# Patient Record
Sex: Male | Born: 1975 | Race: White | Marital: Married
Health system: Northeastern US, Academic
[De-identification: ages and names within clinical notes are randomized; demographics above are authoritative.]

## PROBLEM LIST (undated history)

## (undated) DIAGNOSIS — F329 Major depressive disorder, single episode, unspecified: Secondary | ICD-10-CM

## (undated) DIAGNOSIS — F32A Depression, unspecified: Secondary | ICD-10-CM

## (undated) DIAGNOSIS — K219 Gastro-esophageal reflux disease without esophagitis: Secondary | ICD-10-CM

## (undated) DIAGNOSIS — M1990 Unspecified osteoarthritis, unspecified site: Secondary | ICD-10-CM

## (undated) DIAGNOSIS — N200 Calculus of kidney: Secondary | ICD-10-CM

## (undated) DIAGNOSIS — K859 Acute pancreatitis without necrosis or infection, unspecified: Secondary | ICD-10-CM

## (undated) DIAGNOSIS — E785 Hyperlipidemia, unspecified: Secondary | ICD-10-CM

## (undated) HISTORY — DX: Acute pancreatitis without necrosis or infection, unspecified: K85.90

## (undated) HISTORY — DX: Hyperlipidemia, unspecified: E78.5

## (undated) HISTORY — DX: Gastro-esophageal reflux disease without esophagitis: K21.9

## (undated) HISTORY — PX: OTHER SURGICAL HISTORY: SHX169

## (undated) HISTORY — DX: Unspecified osteoarthritis, unspecified site: M19.90

---

## 1994-04-20 HISTORY — PX: KIDNEY STONE SURGERY: SHX686

## 2004-03-03 ENCOUNTER — Emergency Department (HOSPITAL_COMMUNITY): Admission: EM | Admit: 2004-03-03 | Discharge: 2004-03-04 | Payer: Self-pay | Admitting: Emergency Medicine

## 2009-02-20 ENCOUNTER — Ambulatory Visit: Payer: Self-pay | Admitting: Diagnostic Radiology

## 2009-02-20 ENCOUNTER — Emergency Department (HOSPITAL_BASED_OUTPATIENT_CLINIC_OR_DEPARTMENT_OTHER): Admission: EM | Admit: 2009-02-20 | Discharge: 2009-02-20 | Payer: Self-pay | Admitting: Emergency Medicine

## 2012-12-25 ENCOUNTER — Encounter (HOSPITAL_COMMUNITY): Payer: Self-pay | Admitting: Emergency Medicine

## 2012-12-25 ENCOUNTER — Emergency Department (HOSPITAL_COMMUNITY)
Admission: EM | Admit: 2012-12-25 | Discharge: 2012-12-25 | Disposition: A | Discharge status: Home / Self Care | Payer: Self-pay | Attending: Emergency Medicine | Admitting: Emergency Medicine

## 2012-12-25 DIAGNOSIS — M7020 Olecranon bursitis, unspecified elbow: Secondary | ICD-10-CM | POA: Insufficient documentation

## 2012-12-25 DIAGNOSIS — Z79899 Other long term (current) drug therapy: Secondary | ICD-10-CM | POA: Insufficient documentation

## 2012-12-25 DIAGNOSIS — M7022 Olecranon bursitis, left elbow: Secondary | ICD-10-CM

## 2012-12-25 DIAGNOSIS — F17200 Nicotine dependence, unspecified, uncomplicated: Secondary | ICD-10-CM | POA: Insufficient documentation

## 2012-12-25 DIAGNOSIS — Z87442 Personal history of urinary calculi: Secondary | ICD-10-CM | POA: Insufficient documentation

## 2012-12-25 HISTORY — DX: Calculus of kidney: N20.0

## 2012-12-25 MED ORDER — OXYCODONE-ACETAMINOPHEN 5-325 MG PO TABS: 1.0000 | ORAL_TABLET | ORAL | Status: DC | PRN

## 2012-12-25 MED ORDER — OXYCODONE-ACETAMINOPHEN 5-325 MG PO TABS
1.0000 | ORAL_TABLET | Freq: Once | ORAL | Status: AC
  Administered 2012-12-25: 1 via ORAL
  Filled 2012-12-25: qty 1

## 2012-12-25 MED ORDER — CEPHALEXIN 500 MG PO CAPS: 500.0000 mg | ORAL_CAPSULE | Freq: Four times a day (QID) | ORAL | Status: DC

## 2012-12-25 MED ORDER — SULFAMETHOXAZOLE-TRIMETHOPRIM 800-160 MG PO TABS: 1.0000 | ORAL_TABLET | Freq: Two times a day (BID) | ORAL | Status: DC

## 2012-12-25 NOTE — ED Provider Notes (Signed)
Medical screening examination/treatment/procedure(s) were conducted as a shared visit with non-physician practitioner(s) and myself.  I personally evaluated the patient during the encounter.  EKG Interpretation   None       Patient here with elbow pain. No systemic symptoms. Olecranon bursitis on exam, no concern for septic arthritis of the left elbow. Full ROM, no effusion. Antibiotics, Ortho f/u.  Dagmar Hait, MD 12/25/12 1045

## 2012-12-25 NOTE — ED Provider Notes (Signed)
CSN: [id number]     Arrival date & time 12/25/12  4098 History   First MD Initiated Contact with Patient 12/25/12 [phone]     Chief Complaint  Patient presents with  . left arm swelling   . Arm Pain    left   (Consider location/radiation/quality/duration/timing/severity/associated sxs/prior Treatment) HPI Patient presents with left elbow pain and swelling that began yesterday.  Denies any trauma to the elbow or any repetitive stress to elbow.  Denies fevers, chills, body ache, other joint pain or swelling.  Has had recent bronchitis several weeks ago but denies any other recent infection, denies recent STD treatments or symptoms.  Pain is 8/10, it does not radiate, worse palpation and movement.  Has used ice and ibuprofen without improvement.   Past Medical History  Diagnosis Date  . Kidney stone    Past Surgical History  Procedure Laterality Date  . Kidney stone surgery Right 04/20/1994   History reviewed. No pertinent family history. History  Substance Use Topics  . Smoking status: Light Tobacco Smoker    Types: Cigarettes  . Smokeless tobacco: Not on file  . Alcohol Use: Yes     Comment: everyday    Review of Systems  Constitutional: Negative for fever and chills.  Gastrointestinal: Negative for vomiting, abdominal pain and diarrhea.  Skin: Positive for color change.  Neurological: Negative for weakness and numbness.    Allergies  Doxycycline  Home Medications   Current Outpatient Rx  Name  Route  Sig  Dispense  Refill  . buPROPion (WELLBUTRIN XL) 150 MG 24 hr tablet   Oral   Take 150 mg by mouth daily.         Marland Kitchen ibuprofen (ADVIL,MOTRIN) 200 MG tablet   Oral   Take 1,000 mg by mouth every 6 (six) hours as needed.         Marland Kitchen LORazepam (ATIVAN) 1 MG tablet   Oral   Take 1 mg by mouth 2 (two) times daily as needed for anxiety.         Marland Kitchen omeprazole (PRILOSEC) 10 MG capsule   Oral   Take 10 mg by mouth daily.         . traZODone (DESYREL) 100 MG tablet  Oral   Take 100-300 mg by mouth at bedtime.         . Vilazodone HCl (VIIBRYD) 40 MG TABS   Oral   Take 40 mg by mouth daily.          BP 116/63  Pulse 76  Temp(Src) 98.2 F (36.8 C) (Oral)  Resp 16  SpO2 97% Physical Exam  Nursing note and vitals reviewed. Constitutional: He appears well-developed and well-nourished. No distress.  HENT:  Head: Normocephalic and atraumatic.  Neck: Neck supple.  Pulmonary/Chest: Effort normal.  Musculoskeletal:       Left elbow: He exhibits normal range of motion, no effusion, no deformity and no laceration. No tenderness found.  Left elbow:  Bursa fluctuant, overlying erythema and warmth, tender to palpation.  No bony tenderness.  Distal pulses and sensation intact.  Full AROM, pain with complete extension.  Joint itself if nontender.   Neurological: He is alert.  Skin: He is not diaphoretic.    ED Course  Procedures (including critical care time) Labs Review Labs Reviewed - No data to display Imaging Review No results found.  EKG Interpretation   None      9:41 AM Discussed pt and treatment plan with Dr Gwendolyn Grant who has also  seen and examined the patient.   MDM   1. Olecranon bursitis of left elbow    Pt with spontaneous bursitis of left elbow without injury. Suspect septic bursitis.  No systemic symptoms.  No e/o septic joint.  D/C home with bactrim, keflex, percocet.  Ortho follow up for worsening symptoms.  Discussed findings, treatment, and follow up  with patient.  Pt given return precautions.  Pt verbalizes understanding and agrees with plan.    .  I doubt any other EMC precluding discharge at this time including, but not necessarily limited to the following: septic joint    Trixie Dredge, PA-C 12/25/12 0957  Trixie Dredge, PA-C 12/25/12 [phone]

## 2012-12-25 NOTE — Progress Notes (Signed)
[facility] provided pt with a list of primary care resources and a [facility] [employer] application.  °

## 2012-12-25 NOTE — ED Notes (Signed)
MD at bedside. 

## 2012-12-25 NOTE — ED Notes (Signed)
Pt states that yesterday morning he woke up with left elbow pain and swelling tried to go to work yesterday but unable to perform his duties. Pt states that he iced the elbow the rest of the day yesterday and tried ibuprofen for pain but still having severe pain and increased in swelling this morning.

## 2012-12-26 ENCOUNTER — Inpatient Hospital Stay (HOSPITAL_COMMUNITY)
Admission: EM | Admit: 2012-12-26 | Discharge: 2012-12-28 | DRG: 603 | Disposition: A | Discharge status: Home / Self Care | Payer: Self-pay | Attending: Internal Medicine | Admitting: Internal Medicine

## 2012-12-26 ENCOUNTER — Encounter (HOSPITAL_COMMUNITY): Payer: Self-pay | Admitting: Emergency Medicine

## 2012-12-26 DIAGNOSIS — A499 Bacterial infection, unspecified: Secondary | ICD-10-CM

## 2012-12-26 DIAGNOSIS — M7020 Olecranon bursitis, unspecified elbow: Secondary | ICD-10-CM | POA: Diagnosis present

## 2012-12-26 DIAGNOSIS — F1910 Other psychoactive substance abuse, uncomplicated: Secondary | ICD-10-CM | POA: Diagnosis present

## 2012-12-26 DIAGNOSIS — IMO0002 Reserved for concepts with insufficient information to code with codable children: Principal | ICD-10-CM | POA: Diagnosis present

## 2012-12-26 DIAGNOSIS — F17200 Nicotine dependence, unspecified, uncomplicated: Secondary | ICD-10-CM | POA: Diagnosis present

## 2012-12-26 DIAGNOSIS — Z79899 Other long term (current) drug therapy: Secondary | ICD-10-CM

## 2012-12-26 DIAGNOSIS — M71122 Other infective bursitis, left elbow: Secondary | ICD-10-CM

## 2012-12-26 LAB — RAPID URINE DRUG SCREEN, HOSP PERFORMED
Amphetamines: NOT DETECTED
Cocaine: POSITIVE — AB
Opiates: POSITIVE — AB
Tetrahydrocannabinol: POSITIVE — AB

## 2012-12-26 LAB — HEPATIC FUNCTION PANEL
AST: 12 U/L (ref 0–37)
Albumin: 3.7 g/dL (ref 3.5–5.2)
Alkaline Phosphatase: 79 U/L (ref 39–117)
Total Bilirubin: 0.4 mg/dL (ref 0.3–1.2)
Total Protein: 6.9 g/dL (ref 6.0–8.3)

## 2012-12-26 LAB — HEPATITIS PANEL, ACUTE
HCV Ab: NEGATIVE
Hep A IgM: NONREACTIVE
Hep B C IgM: NONREACTIVE
Hepatitis B Surface Ag: NEGATIVE

## 2012-12-26 LAB — BASIC METABOLIC PANEL
BUN: 8 mg/dL (ref 6–23)
CO2: 25 mEq/L (ref 19–32)
Chloride: 100 mEq/L (ref 96–112)
GFR calc Af Amer: >90 mL/min (ref >90)
Glucose, Bld: 99 mg/dL (ref 70–99)
Potassium: 3.8 mEq/L (ref 3.5–5.1)

## 2012-12-26 LAB — HIV ANTIBODY (ROUTINE TESTING W REFLEX): HIV: NONREACTIVE

## 2012-12-26 LAB — CBC
HCT: 42.3 % (ref 39.0–52.0)
Hemoglobin: 14.3 g/dL (ref 13.0–17.0)
MCV: 90 fL (ref 78.0–100.0)
RBC: 4.7 MIL/uL (ref 4.22–5.81)
RDW: 13.2 % (ref 11.5–15.5)
WBC: 9.3 10*3/uL (ref 4.0–10.5)

## 2012-12-26 MED ORDER — PANTOPRAZOLE SODIUM 20 MG PO TBEC
20.0000 mg | DELAYED_RELEASE_TABLET | Freq: Every day | ORAL | Status: DC
  Administered 2012-12-26 – 2012-12-28 (×3): 20 mg via ORAL
  Filled 2012-12-26 (×3): qty 1

## 2012-12-26 MED ORDER — SODIUM CHLORIDE 0.9 % IV SOLN
INTRAVENOUS | Status: DC
  Administered 2012-12-26: 100 mL/h via INTRAVENOUS
  Administered 2012-12-27: 07:00:00 via INTRAVENOUS

## 2012-12-26 MED ORDER — LORAZEPAM 1 MG PO TABS
1.0000 mg | ORAL_TABLET | Freq: Two times a day (BID) | ORAL | Status: DC | PRN
  Administered 2012-12-26 – 2012-12-27 (×3): 1 mg via ORAL
  Filled 2012-12-26 (×3): qty 1

## 2012-12-26 MED ORDER — ONDANSETRON HCL 4 MG/2ML IJ SOLN: 4.0000 mg | Freq: Four times a day (QID) | INTRAMUSCULAR | Status: DC | PRN

## 2012-12-26 MED ORDER — MORPHINE SULFATE 4 MG/ML IJ SOLN
4.0000 mg | Freq: Once | INTRAMUSCULAR | Status: AC
  Administered 2012-12-26: 4 mg via INTRAVENOUS
  Filled 2012-12-26: qty 1

## 2012-12-26 MED ORDER — DIPHENHYDRAMINE HCL 50 MG PO CAPS
50.0000 mg | ORAL_CAPSULE | Freq: Four times a day (QID) | ORAL | Status: DC | PRN
  Administered 2012-12-26 – 2012-12-28 (×3): 50 mg via ORAL
  Filled 2012-12-26 (×5): qty 1

## 2012-12-26 MED ORDER — OXYCODONE-ACETAMINOPHEN 5-325 MG PO TABS
1.0000 | ORAL_TABLET | ORAL | Status: DC | PRN
  Administered 2012-12-26 – 2012-12-28 (×4): 2 via ORAL
  Filled 2012-12-26 (×5): qty 2

## 2012-12-26 MED ORDER — ENOXAPARIN SODIUM 80 MG/0.8ML ~~LOC~~ SOLN
70.0000 mg | SUBCUTANEOUS | Status: DC
  Administered 2012-12-26 – 2012-12-27 (×2): 70 mg via SUBCUTANEOUS
  Filled 2012-12-26 (×3): qty 0.8

## 2012-12-26 MED ORDER — BUPROPION HCL ER (XL) 150 MG PO TB24
150.0000 mg | ORAL_TABLET | Freq: Every day | ORAL | Status: DC
  Administered 2012-12-26 – 2012-12-28 (×3): 150 mg via ORAL
  Filled 2012-12-26 (×3): qty 1

## 2012-12-26 MED ORDER — ENOXAPARIN SODIUM 40 MG/0.4ML ~~LOC~~ SOLN
40.0000 mg | SUBCUTANEOUS | Status: DC
  Filled 2012-12-26: qty 0.4

## 2012-12-26 MED ORDER — HYDROMORPHONE HCL PF 1 MG/ML IJ SOLN
1.0000 mg | INTRAMUSCULAR | Status: DC | PRN
  Administered 2012-12-26 – 2012-12-28 (×9): 1 mg via INTRAVENOUS
  Filled 2012-12-26 (×9): qty 1

## 2012-12-26 MED ORDER — VILAZODONE HCL 40 MG PO TABS
40.0000 mg | ORAL_TABLET | Freq: Every day | ORAL | Status: DC
  Administered 2012-12-26 – 2012-12-28 (×3): 40 mg via ORAL
  Filled 2012-12-26 (×3): qty 1

## 2012-12-26 MED ORDER — VANCOMYCIN HCL 10 G IV SOLR
1500.0000 mg | Freq: Once | INTRAVENOUS | Status: DC
  Filled 2012-12-26: qty 1500

## 2012-12-26 MED ORDER — CLINDAMYCIN HCL 300 MG PO CAPS
450.0000 mg | ORAL_CAPSULE | Freq: Once | ORAL | Status: DC
  Filled 2012-12-26: qty 1

## 2012-12-26 MED ORDER — VANCOMYCIN HCL 10 G IV SOLR
2000.0000 mg | Freq: Once | INTRAVENOUS | Status: AC
  Administered 2012-12-26: 2000 mg via INTRAVENOUS
  Filled 2012-12-26: qty 2000

## 2012-12-26 MED ORDER — CLINDAMYCIN PHOSPHATE 600 MG/50ML IV SOLN
600.0000 mg | Freq: Once | INTRAVENOUS | Status: AC
  Administered 2012-12-26: 600 mg via INTRAVENOUS
  Filled 2012-12-26: qty 50

## 2012-12-26 MED ORDER — ONDANSETRON HCL 4 MG PO TABS: 4.0000 mg | ORAL_TABLET | Freq: Four times a day (QID) | ORAL | Status: DC | PRN

## 2012-12-26 MED ORDER — VANCOMYCIN HCL 10 G IV SOLR
1250.0000 mg | Freq: Two times a day (BID) | INTRAVENOUS | Status: DC
  Administered 2012-12-27 – 2012-12-28 (×3): 1250 mg via INTRAVENOUS
  Filled 2012-12-26 (×4): qty 1250

## 2012-12-26 MED ORDER — TRAZODONE HCL 100 MG PO TABS
100.0000 mg | ORAL_TABLET | Freq: Every day | ORAL | Status: DC
  Administered 2012-12-26 – 2012-12-28 (×4): 100 mg via ORAL
  Filled 2012-12-26 (×4): qty 3

## 2012-12-26 NOTE — Progress Notes (Signed)
   CARE MANAGEMENT ED NOTE 12/26/2012  Patient:  Crowl,Dvid TODD   Account Number:  [id number]  Date Initiated:  12/26/2012  Documentation initiated by:  Dennis Bast Assessment:   37 yr old male self pay patient of [place] seen by [facility] staff on 12/25/12 and given oc appllication and list of self pay pcps Pt brought these items back with him to [facility] ED on 12/26/12 States he did not make a f/u appt 12/25/12     Subjective/Objective Assessment Detail:     Action/Plan:   see below note   Action/Plan Detail:   Anticipated DC Date:  12/29/2012     Status Recommendation to Physician:   Result of Recommendation:    Other ED Services  Consult Working Plan    DC Planning Services  Other  PCP issues  Outpatient Services - Pt will follow up    Choice offered to / List presented to:            Status of service:  Completed, signed off  ED Comments:   ED Comments Detail:  CM discussed and provided written information for self pay pcps, importance of pcp for f/u care, www.needymeds.org, discounted pharmacies and other [place] resources such as financial assistance, DSS and  health department Reviewed resources for [employer] self pay pcps like [facility], family medicine at [address], [facility] family practice, general medical clinics, [facility] urgent care plus others, [facility] out patient pharmacies and housing Pt voiced understanding and appreciation of resources provided  Provided [facility] contact information again Pt encouraged to review information prior to d/c for f/u MD Placed items including forms form 12/25/12 in pt belonging bag for pt

## 2012-12-26 NOTE — ED Provider Notes (Signed)
CSN: [id number]     Arrival date & time 12/26/12  2595 History   First MD Initiated Contact with Patient 12/26/12 0815     Chief Complaint  Patient presents with  . Joint Swelling   (Consider location/radiation/quality/duration/timing/severity/associated sxs/prior Treatment) HPI Comments: Treated for L elbow bursitis with keflex yesterday by me. Worsened overnight.  Patient is a 37 y.o. male presenting with extremity pain. The history is provided by the patient.  Extremity Pain This is a new problem. The current episode started more than 2 days ago. The problem occurs constantly. The problem has been gradually worsening. Pertinent negatives include no chest pain, no abdominal pain and no shortness of breath. Nothing aggravates the symptoms. Nothing relieves the symptoms.    Past Medical History  Diagnosis Date  . Kidney stone    Past Surgical History  Procedure Laterality Date  . Kidney stone surgery Right 04/20/1994   No family history on file. History  Substance Use Topics  . Smoking status: Light Tobacco Smoker    Types: Cigarettes  . Smokeless tobacco: Not on file  . Alcohol Use: Yes     Comment: everyday    Review of Systems  Constitutional: Negative for fever and chills.  Respiratory: Negative for cough and shortness of breath.   Cardiovascular: Negative for chest pain.  Gastrointestinal: Negative for nausea, vomiting and abdominal pain.  All other systems reviewed and are negative.    Allergies  Doxycycline  Home Medications   Current Outpatient Rx  Name  Route  Sig  Dispense  Refill  . buPROPion (WELLBUTRIN XL) 150 MG 24 hr tablet   Oral   Take 150 mg by mouth daily.         . cephALEXin (KEFLEX) 500 MG capsule   Oral   Take 1 capsule (500 mg total) by mouth 4 (four) times daily.   28 capsule   0   . LORazepam (ATIVAN) 1 MG tablet   Oral   Take 1 mg by mouth 2 (two) times daily as needed for anxiety.         Marland Kitchen omeprazole (PRILOSEC) 10 MG  capsule   Oral   Take 10 mg by mouth daily.         Marland Kitchen oxyCODONE-acetaminophen (PERCOCET/ROXICET) 5-325 MG per tablet   Oral   Take 1-2 tablets by mouth every 4 (four) hours as needed for severe pain.   20 tablet   0   . sulfamethoxazole-trimethoprim (BACTRIM DS,SEPTRA DS) 800-160 MG per tablet   Oral   Take 1 tablet by mouth 2 (two) times daily.   14 tablet   0   . traZODone (DESYREL) 100 MG tablet   Oral   Take 100-300 mg by mouth at bedtime.         . Vilazodone HCl (VIIBRYD) 40 MG TABS   Oral   Take 40 mg by mouth daily.          BP 116/92  Pulse 83  Temp(Src) 98.2 F (36.8 C) (Oral)  Resp 20  SpO2 97% Physical Exam  Nursing note and vitals reviewed. Constitutional: He is oriented to person, place, and time. He appears well-developed and well-nourished. No distress.  HENT:  Head: Normocephalic and atraumatic.  Mouth/Throat: No oropharyngeal exudate.  Eyes: EOM are normal. Pupils are equal, round, and reactive to light.  Neck: Normal range of motion. Neck supple.  Cardiovascular: Normal rate and regular rhythm.  Exam reveals no friction rub.   No murmur heard. Pulmonary/Chest:  Effort normal and breath sounds normal. No respiratory distress. He has no wheezes. He has no rales.  Abdominal: He exhibits no distension. There is no tenderness. There is no rebound.  Musculoskeletal: He exhibits no edema.       Left elbow: He exhibits decreased range of motion and swelling. He exhibits no effusion, no deformity and no laceration. Tenderness (diffuse) found.  L olecranon fluctuant with erythema. Tenderness posteriorly extending proximal and distal. Decreased ROM of elbow.  Neurological: He is alert and oriented to person, place, and time.  Skin: He is not diaphoretic.    ED Course  Apiration of blood/fluid Date/Time: 12/26/2012 9:47 AM Performed by: Dagmar Hait Authorized by: Dagmar Hait Consent: Verbal consent obtained. Preparation: Patient  was prepped and draped in the usual sterile fashion. Local anesthesia used: yes Anesthesia: local infiltration Local anesthetic: lidocaine 2% with epinephrine and lidocaine 2% without epinephrine Anesthetic total: 1 ml Patient sedated: no Patient tolerance: Patient tolerated the procedure well with no immediate complications. Comments: L olecranon bursa prepped with chlorhexidine. 18g needle used to aspirate fluid from L olecranon bursa. Scant fluid returned after 2 attempts. Not enough fluid to send to the lab. Band-aid applied at the site after the aspiration.    (including critical care time) Labs Review Labs Reviewed  BASIC METABOLIC PANEL - Abnormal; Notable for the following:    Sodium 134 (*)    All other components within normal limits  CBC   Imaging Review No results found.  EKG Interpretation   None       MDM   1. Septic olecranon bursitis, left    55M seen yesterday by me for olecranon bursitis presents with worsening elbow pain. Here today he has worsening of his bursitis. His L olecranon is fluctuant. He has decreased ROM of L elbow, yesterday was full, today much more limited. NVI distally. He has surrounding cellulitis around his L elbow. Denies any other systemic symptoms. With worsening bursitis, will give IV pain meds, check labs, and prepare for IV antibiotics. Will consult Ortho for recommendations. On Keflex and Bactrim at home. I tried to aspirate the bursa without success. Ortho states IV antibiotics are warranted, patient admitted to hospitalist service.   Dagmar Hait, MD 12/26/12 [phone]

## 2012-12-26 NOTE — ED Notes (Signed)
Attempted to call to give report to floor, nurse was tied up and was told would call back to get report.

## 2012-12-26 NOTE — H&P (Signed)
[facility] History and Physical  Randy Neal ION:[id number] DOB: [birth year] DOA: 12/26/2012  Referring physician: ED physician PCP: No primary provider on file.   Chief Complaint: left elbow pain and swelling   HPI:  Patient is 37 year old male who presents to emergency department with main concern of several days duration of left elbow pain associated with swelling and redness. Patient explains he came to emergency department one day prior to this admission and was discharged on antibiotic but his symptoms continued to get worse. He describes pain as persistent and throbbing, 10/10 in severity, nonradiating, localized to left elbow area, no known traumas to the area, no similar events in the past. Patient explains he has been having difficulty with rotation and extending his elbow do to pain. Patient denies systemic symptoms such as chest pain or shortness of breath, no fevers and chills, no abdominal or urinary concerns.  In emergency department, left elbow noted to be more erythematous and swollen compared to yesterday examination. This was noted by emergency room physician who has seen patient yesterday. Orthopedic team was consulted for further evaluation and [facility] asked to admit to medical floor.  Assessment and Plan: Left olecranon bursitis - Will admit patient to medical floor and start on IV vancomycin - Please note patient has received dose of clindamycin IV in emergency department - Orthopedic team was consulted and will continue to followup on recommendations - Will check HIV, hepatic function panel, acute hepatitis panel, urine drug screen, blood culture x 2  - Provide analgesia as needed for symptom control  Code Status: Full Family Communication: Pt at bedside Disposition Plan: Admit to medical floor    Review of Systems:  Constitutional: Negative for fever, chills and malaise/fatigue. Negative for diaphoresis.  HENT: Negative for hearing loss, ear pain,  nosebleeds, congestion, sore throat, neck pain, tinnitus and ear discharge.   Eyes: Negative for blurred vision, double vision, photophobia, pain, discharge and redness.  Respiratory: Negative for cough, hemoptysis, sputum production, shortness of breath, wheezing and stridor.   Cardiovascular: Negative for chest pain, palpitations, orthopnea, claudication and leg swelling.  Gastrointestinal: Negative for nausea, vomiting and abdominal pain. Negative for heartburn, constipation, blood in stool and melena.  Genitourinary: Negative for dysuria, urgency, frequency, hematuria and flank pain.  Musculoskeletal: Per HPI Skin: Negative for itching and rash.  Neurological: Negative for tingling, tremors, sensory change, speech change, focal weakness, loss of consciousness and headaches.  Endo/Heme/Allergies: Negative for environmental allergies and polydipsia. Does not bruise/bleed easily.  Psychiatric/Behavioral: Negative for suicidal ideas. The patient is not nervous/anxious.      Past Medical History  Diagnosis Date  . Kidney stone     Past Surgical History  Procedure Laterality Date  . Kidney stone surgery Right 04/20/1994    Social History:  reports that he has been smoking Cigarettes.  He has been smoking about 0.00 packs per day. He does not have any smokeless tobacco history on file. He reports that he drinks alcohol. His drug history is not on file.  Allergies  Allergen Reactions  . Doxycycline Rash    No family history on file.  Prior to Admission medications   Medication Sig Start Date End Date Taking? Authorizing Provider  buPROPion (WELLBUTRIN XL) 150 MG 24 hr tablet Take 150 mg by mouth daily.   Yes Historical Provider, MD  cephALEXin (KEFLEX) 500 MG capsule Take 1 capsule (500 mg total) by mouth 4 (four) times daily. 12/25/12  Yes Trixie Dredge, PA-C  LORazepam (ATIVAN) 1 MG  tablet Take 1 mg by mouth 2 (two) times daily as needed for anxiety.   Yes Historical Provider, MD   omeprazole (PRILOSEC) 10 MG capsule Take 10 mg by mouth daily.   Yes Historical Provider, MD  oxyCODONE-acetaminophen (PERCOCET/ROXICET) 5-325 MG per tablet Take 1-2 tablets by mouth every 4 (four) hours as needed for severe pain. 12/25/12  Yes Trixie Dredge, PA-C  sulfamethoxazole-trimethoprim (BACTRIM DS,SEPTRA DS) 800-160 MG per tablet Take 1 tablet by mouth 2 (two) times daily. 12/25/12 01/01/13 Yes Trixie Dredge, PA-C  traZODone (DESYREL) 100 MG tablet Take 100-300 mg by mouth at bedtime.   Yes Historical Provider, MD  Vilazodone HCl (VIIBRYD) 40 MG TABS Take 40 mg by mouth daily.   Yes Historical Provider, MD    Physical Exam: Filed Vitals:   12/26/12 0821  BP: 116/92  Pulse: 83  Temp: 98.2 F (36.8 C)  TempSrc: Oral  Resp: 20  SpO2: 97%    Physical Exam  Constitutional: Appears well-developed and well-nourished. No distress.  HENT: Normocephalic. External right and left ear normal. Dry MM Eyes: Conjunctivae and EOM are normal. PERRLA, no scleral icterus.  Neck: Normal ROM. Neck supple. No JVD. No tracheal deviation. No thyromegaly.  CVS: RRR, S1/S2 +, no murmurs, no gallops, no carotid bruit.  Pulmonary: Effort and breath sounds normal, no stridor, rhonchi, wheezes, rales.  Abdominal: Soft. BS +,  no distension, tenderness, rebound or guarding.  Musculoskeletal: Left elbow area is swollen and erythematous, tender to palpation, warm to touch.  Lymphadenopathy: No lymphadenopathy noted, cervical, inguinal. Neuro: Alert. Normal reflexes, muscle tone coordination. No cranial nerve deficit. Skin: Skin is warm and dry. No rash noted. Not diaphoretic. No erythema. No pallor.  Psychiatric: Normal mood and affect. Behavior, judgment, thought content normal.   Labs on Admission:  Basic Metabolic Panel:  Recent Labs Lab 12/26/12 0835  NA 134*  K 3.8  CL 100  CO2 25  GLUCOSE 99  BUN 8  CREATININE 0.93  CALCIUM 9.4   CBC:  Recent Labs Lab 12/26/12 0835  WBC 9.3  HGB 14.3   HCT 42.3  MCV 90.0  PLT 236   Radiological Exams on Admission: No results found.  EKG: Normal sinus rhythm, no ST/T wave changes  Randy Presto, MD  [facility] Pager [phone]  If 7PM-7AM, please contact night-coverage [email or web address] Password [facility] 12/26/2012, 10:47 AM

## 2012-12-26 NOTE — Consult Note (Signed)
Marcene Corning, MD           Bryna Colander, PA-C   [address], [place], [place]  [address]   ORTHOPAEDIC CONSULTATION  Albertus Chiarelli            MRN:  [id number] DOB/SEX:  12/22/1975/male    REQUESTING PHYSICIAN:    CHIEF COMPLAINT:  Painful left elbow  HISTORY: Mang Hazelrigg Cecilis a 37 y.o. male with several days of left elbow pain and redness.  Was seen in ED yesterday and started on Keflex.  Went home and no better so returned today and admitted to medical team and started on IV vanco.  Other tests pending including HIV etc.  ORS consulted   PAST MEDICAL HISTORY: There are no active problems to display for this patient.  Past Medical History  Diagnosis Date  . Kidney stone    Past Surgical History  Procedure Laterality Date  . Kidney stone surgery Right 04/20/1994     MEDICATIONS:  Current facility-administered medications:0.9 %  sodium chloride infusion, , Intravenous, Continuous, Dorothea Ogle, MD, Last Rate: 100 mL/hr at 12/26/12 1524, 100 mL/hr at 12/26/12 1524;  buPROPion (WELLBUTRIN XL) 24 hr tablet 150 mg, 150 mg, Oral, Daily, Dorothea Ogle, MD, 150 mg at 12/26/12 1523;  diphenhydrAMINE (BENADRYL) capsule 50 mg, 50 mg, Oral, Q6H PRN, Velna Ochs, MD enoxaparin (LOVENOX) injection 70 mg, 70 mg, Subcutaneous, Q24H, Dorothea Ogle, MD;  HYDROmorphone (DILAUDID) injection 1 mg, 1 mg, Intravenous, Q2H PRN, Dorothea Ogle, MD;  LORazepam (ATIVAN) tablet 1 mg, 1 mg, Oral, BID PRN, Dorothea Ogle, MD;  ondansetron [facility]) injection 4 mg, 4 mg, Intravenous, Q6H PRN, Dorothea Ogle, MD;  ondansetron [facility]) tablet 4 mg, 4 mg, Oral, Q6H PRN, Dorothea Ogle, MD oxyCODONE-acetaminophen (PERCOCET/ROXICET) 5-325 MG per tablet 1-2 tablet, 1-2 tablet, Oral, Q4H PRN, Dorothea Ogle, MD, 2 tablet at 12/26/12 1523;  pantoprazole (PROTONIX) EC tablet 20 mg, 20 mg, Oral, Daily, Dorothea Ogle, MD, 20 mg at 12/26/12 1523;  traZODone (DESYREL) tablet 100-300 mg, 100-300 mg, Oral, QHS,  Dorothea Ogle, MD Melene Muller ON 12/27/2012] vancomycin (VANCOCIN) 1,250 mg in sodium chloride 0.9 % 250 mL IVPB, 1,250 mg, Intravenous, Q12H, Terri L Green, RPH;  Vilazodone HCl (VIIBRYD) TABS 40 mg, 40 mg, Oral, Daily, Dorothea Ogle, MD, 40 mg at 12/26/12 1539  ALLERGIES:   Allergies  Allergen Reactions  . Doxycycline Rash    REVIEW OF SYSTEMS: REVIEWED IN DETAIL IN CHART  FAMILY HISTORY:  History reviewed. No pertinent family history.  SOCIAL HISTORY:   History  Substance Use Topics  . Smoking status: Light Tobacco Smoker    Types: Cigarettes  . Smokeless tobacco: Never Used  . Alcohol Use: Yes     Comment: everyday     EXAMINATION: Vital signs in last 24 hours: Temp:  [97.8 F (36.6 C)-98.2 F (36.8 C)] 97.9 F (36.6 C) (11/11 1257) Pulse Rate:  [63-83] 63 (11/11 1257) Resp:  [16-20] 20 (11/11 1257) BP: (95-124)/(51-92) 124/75 mmHg (11/11 1257) SpO2:  [97 %-99 %] 99 % (11/11 1257) Weight:  [133.131 kg (293 lb 8 oz)] 133.131 kg (293 lb 8 oz) (11/11 1410)  BP 124/75  Pulse 63  Temp(Src) 97.9 F (36.6 C) (Oral)  Resp 20  Ht 5\' 9"  (1.753 m)  Wt 133.131 kg (293 lb 8 oz)  BMI 43.32 kg/m2  SpO2 99%  Musculoskeletal Exam  :left elbow red but no fluctuance.  ROM good and nearly full.  No red streaks.  Most of chest is red as is neck c/w allergic response   DIAGNOSTIC STUDIES: Recent laboratory studies:  Recent Labs  12/26/12 0835  WBC 9.3  HGB 14.3  HCT 42.3  PLT 236    Recent Labs  12/26/12 0835  NA 134*  K 3.8  CL 100  CO2 25  BUN 8  CREATININE 0.93  GLUCOSE 99  CALCIUM 9.4   No results found for this basename: INR, PROTIME     Recent Radiographic Studies :  No results found.  ASSESSMENT: left elbow cellulitis   PLAN: agree with IV vanco.  Seems allergic to something and will try some benadryl.  May be Percocet.  Hopefully not the vanco (red man).  Will follow with medical team but see no surgical indications  currently.  Sunnie Odden G 12/26/2012, 5:24 PM

## 2012-12-26 NOTE — Progress Notes (Signed)
ANTIBIOTIC CONSULT NOTE - INITIAL  Pharmacy Consult for Vancomycin Indication: Bursitis, left elbow  Allergies  Allergen Reactions  . Doxycycline Rash   Patient Measurements: Height: 5\' 9"  (175.3 cm) Weight: 293 lb 8 oz (133.131 kg) IBW/kg (Calculated) : 70.7  Vital Signs: Temp: 97.9 F (36.6 C) (11/11 1257) Temp src: Oral (11/11 1257) BP: 124/75 mmHg (11/11 1257) Pulse Rate: 63 (11/11 1257) Intake/Output from previous day:   Intake/Output from this shift:    Labs:  Recent Labs  12/26/12 0835  WBC 9.3  HGB 14.3  PLT 236  CREATININE 0.93   Estimated Creatinine Clearance: 145.8 ml/min (by C-G formula based on Cr of 0.93). No results found for this basename: VANCOTROUGH, VANCOPEAK, VANCORANDOM, GENTTROUGH, GENTPEAK, GENTRANDOM, TOBRATROUGH, TOBRAPEAK, TOBRARND, AMIKACINPEAK, AMIKACINTROU, AMIKACIN,  in the last 72 hours   Microbiology: No results found for this or any previous visit (from the past 720 hour(s)).  Medical History: Past Medical History  Diagnosis Date  . Kidney stone    Medications:  Anti-infectives   Start     Dose/Rate Route Frequency Ordered Stop   12/27/12 0600  vancomycin (VANCOCIN) 1,250 mg in sodium chloride 0.9 % 250 mL IVPB     1,250 mg 166.7 mL/hr over 90 Minutes Intravenous Every 12 hours 12/26/12 1420     12/26/12 1500  vancomycin (VANCOCIN) 2,000 mg in sodium chloride 0.9 % 500 mL IVPB     2,000 mg 250 mL/hr over 120 Minutes Intravenous  Once 12/26/12 1419     12/26/12 1030  clindamycin (CLEOCIN) IVPB 600 mg     600 mg 100 mL/hr over 30 Minutes Intravenous  Once 12/26/12 0946 12/26/12 1122   12/26/12 0930  clindamycin (CLEOCIN) capsule 450 mg  Status:  Discontinued     450 mg Oral  Once 12/26/12 0926 12/26/12 0946   12/26/12 0900  vancomycin (VANCOCIN) 1,500 mg in sodium chloride 0.9 % 500 mL IVPB  Status:  Discontinued     1,500 mg 250 mL/hr over 120 Minutes Intravenous  Once 12/26/12 0829 12/26/12 0454     Assessment: 37 yoM  seen in ED yesterday for swollen, painful L elbow: discharged with Keflex & Septra. Returned to ED today; elbow worsened. Received one dose of Clindamycin in ED this am, begin Vancomycin per pharmacy.  Urine drug screen positive for Cocaine and THC. Script for Percocet from ED would explain + opiates.  Renal fx normal, pt obese  Blood cultures ordered, drawn after Clindamycin dose in ED  Goal of Therapy:  Vancomycin trough level 15-20 mcg/ml  Plan:   Vancomycin 2gm x1 dose, then 1250mg  q12  Monitor cultures, renal function  Otho Bellows PharmD Pager [phone] 12/26/2012, 2:26 PM

## 2012-12-26 NOTE — ED Notes (Signed)
Pt was seen yesterday for bursitis of the left elbow, states he took his meds yesterday but symptoms seem to get worse yesterday afternoon and last night. States elbow is twice as big as it was yesterday.

## 2012-12-26 NOTE — Progress Notes (Signed)
UR completed 

## 2012-12-27 LAB — CBC
HCT: 40.6 % (ref 39.0–52.0)
MCH: 30.2 pg (ref 26.0–34.0)
MCV: 90.8 fL (ref 78.0–100.0)
Platelets: 230 10*3/uL (ref 150–400)
RBC: 4.47 MIL/uL (ref 4.22–5.81)
WBC: 5.9 10*3/uL (ref 4.0–10.5)

## 2012-12-27 LAB — BASIC METABOLIC PANEL
CO2: 28 mEq/L (ref 19–32)
Chloride: 102 mEq/L (ref 96–112)
Creatinine, Ser: 1.03 mg/dL (ref 0.50–1.35)
Glucose, Bld: 124 mg/dL — ABNORMAL HIGH (ref 70–99)
Sodium: 137 mEq/L (ref 135–145)

## 2012-12-27 MED ORDER — DOCUSATE SODIUM 100 MG PO CAPS
200.0000 mg | ORAL_CAPSULE | Freq: Every day | ORAL | Status: DC
  Administered 2012-12-27: 200 mg via ORAL
  Filled 2012-12-27 (×2): qty 2

## 2012-12-27 NOTE — Progress Notes (Signed)
Patient ID: Randy Neal, male   DOB: [birth year], 37 y.o.   MRN: [id number] [facility] PROGRESS NOTE  Randy Neal WUJ:[id number] DOB: [birth year] DOA: 12/26/2012 PCP: No primary provider on file.  Brief narrative: Patient is 37 year old male who presents to emergency department with main concern of several days duration of left elbow pain associated with swelling and redness. Patient explains he came to emergency department one day prior to this admission and was discharged on antibiotic but his symptoms continued to get worse. He describes pain as persistent and throbbing, 10/10 in severity, nonradiating, localized to left elbow area, no known traumas to the area, no similar events in the past. Patient explains he has been having difficulty with rotation and extending his elbow do to pain. Patient denies systemic symptoms such as chest pain or shortness of breath, no fevers and chills, no abdominal or urinary concerns.   In emergency department, left elbow noted to be more erythematous and swollen compared to yesterday examination. This was noted by emergency room physician who has seen patient yesterday. Orthopedic team was consulted for further evaluation and [facility] asked to admit to medical floor.   Assessment and Plan:  Left olecranon bursitis  - slight improvement, continue Vancomycin day #2  - Please note patient has received dose of clindamycin IV in emergency department  - Orthopedic's team input appreciated and will continue to followup on recommendations  - blood cultures pending but negative to date  - continue analgesia as needed for symptom control  Polysubstance abuse - UDS positive for opiates, THC, cocaine - discussed cessation in detail   Consultants:  Ortho   Procedures/Studies:  None  Antibiotics:  Vancomycin 11/11 -->  Code Status: Full Family Communication: Pt at bedside Disposition Plan: Home when medically stable  HPI/Subjective: No  events overnight.   Objective: Filed Vitals:   12/26/12 1257 12/26/12 1410 12/26/12 2200 12/27/12 0600  BP: 124/75  103/60 104/60  Pulse: 63  62 72  Temp: 97.9 F (36.6 C)  98.8 F (37.1 C) 99 F (37.2 C)  TempSrc: Oral  Oral Oral  Resp: 20  18 18   Height:  5\' 9"  (1.753 m)    Weight:  133.131 kg (293 lb 8 oz)    SpO2: 99%  98% 96%    Intake/Output Summary (Last 24 hours) at 12/27/12 1137 Last data filed at 12/27/12 1000  Gross per 24 hour  Intake   3270 ml  Output      0 ml  Net   3270 ml    Exam:   General:  Pt is alert, follows commands appropriately, not in acute distress  Cardiovascular: Regular rate and rhythm, S1/S2, no murmurs, no rubs, no gallops  Respiratory: Clear to auscultation bilaterally, no wheezing, no crackles, no rhonchi  Abdomen: Soft, non tender, non distended, bowel sounds present, no guarding  Extremities: No edema, pulses DP and PT palpable bilaterally, left elbow swelling decreasing but with persistent mild erythema and TTP   Neuro: Grossly nonfocal  Data Reviewed: Basic Metabolic Panel:  Recent Labs Lab 12/26/12 0835 12/27/12 0533  NA 134* 137  K 3.8 4.2  CL 100 102  CO2 25 28  GLUCOSE 99 124*  BUN 8 14  CREATININE 0.93 1.03  CALCIUM 9.4 9.4   Liver Function Tests:  Recent Labs Lab 12/26/12 1110  AST 12  ALT 21  ALKPHOS 79  BILITOT 0.4  PROT 6.9  ALBUMIN 3.7   CBC:  Recent Labs Lab 12/26/12 0835  12/27/12 0533  WBC 9.3 5.9  HGB 14.3 13.5  HCT 42.3 40.6  MCV 90.0 90.8  PLT 236 230   Recent Results (from the past 240 hour(s))  CULTURE, BLOOD (ROUTINE X 2)     Status: None   Collection Time    12/26/12 11:10 AM      Result Value Range Status   Specimen Description BLOOD RIGHT ARM   Final   Special Requests BOTTLES DRAWN AEROBIC AND ANAEROBIC 5CC   Final   Culture  Setup Time     Final   Value: 12/26/2012 14:10     Performed at [facility]   Culture     Final   Value:        BLOOD CULTURE RECEIVED  NO GROWTH TO DATE CULTURE WILL BE HELD FOR 5 DAYS BEFORE ISSUING A FINAL NEGATIVE REPORT     Performed at [facility]   Report Status PENDING   Incomplete  CULTURE, BLOOD (ROUTINE X 2)     Status: None   Collection Time    12/26/12 11:15 AM      Result Value Range Status   Specimen Description BLOOD RIGHT HAND   Final   Special Requests BOTTLES DRAWN AEROBIC AND ANAEROBIC 5CC   Final   Culture  Setup Time     Final   Value: 12/26/2012 14:10     Performed at [facility]   Culture     Final   Value:        BLOOD CULTURE RECEIVED NO GROWTH TO DATE CULTURE WILL BE HELD FOR 5 DAYS BEFORE ISSUING A FINAL NEGATIVE REPORT     Performed at [facility]   Report Status PENDING   Incomplete     Scheduled Meds: . buPROPion  150 mg Oral Daily  . enoxaparin (LOVENOX) injection  70 mg Subcutaneous Q24H  . pantoprazole  20 mg Oral Daily  . traZODone  100-300 mg Oral QHS  . vancomycin  1,250 mg Intravenous Q12H  . Vilazodone HCl  40 mg Oral Daily   Continuous Infusions: . sodium chloride 100 mL/hr at 12/27/12 1610    Debbora Presto, MD  [facility] Pager [phone]  If 7PM-7AM, please contact night-coverage [email or web address] Password [email or web address] 12/27/2012, 11:37 AM   LOS: 1 day

## 2012-12-28 ENCOUNTER — Inpatient Hospital Stay (HOSPITAL_COMMUNITY): Payer: Self-pay

## 2012-12-28 LAB — BASIC METABOLIC PANEL
BUN: 9 mg/dL (ref 6–23)
CO2: 28 mEq/L (ref 19–32)
Calcium: 9.2 mg/dL (ref 8.4–10.5)
Creatinine, Ser: 0.91 mg/dL (ref 0.50–1.35)
GFR calc non Af Amer: >90 mL/min (ref >90)
Glucose, Bld: 113 mg/dL — ABNORMAL HIGH (ref 70–99)
Potassium: 4.2 mEq/L (ref 3.5–5.1)

## 2012-12-28 LAB — CBC
HCT: 38.9 % — ABNORMAL LOW (ref 39.0–52.0)
Hemoglobin: 13 g/dL (ref 13.0–17.0)
MCH: 30.2 pg (ref 26.0–34.0)
MCHC: 33.4 g/dL (ref 30.0–36.0)
MCV: 90.5 fL (ref 78.0–100.0)
RBC: 4.3 MIL/uL (ref 4.22–5.81)

## 2012-12-28 MED ORDER — GADOBENATE DIMEGLUMINE 529 MG/ML IV SOLN
18.0000 mL | Freq: Once | INTRAVENOUS | Status: AC | PRN
  Administered 2012-12-28: 18 mL via INTRAVENOUS

## 2012-12-28 MED ORDER — CLINDAMYCIN HCL 300 MG PO CAPS: 300.0000 mg | ORAL_CAPSULE | Freq: Three times a day (TID) | ORAL | Status: DC

## 2012-12-28 MED ORDER — OXYCODONE-ACETAMINOPHEN 5-325 MG PO TABS: 1.0000 | ORAL_TABLET | ORAL | Status: DC | PRN

## 2012-12-28 MED ORDER — LORAZEPAM 1 MG PO TABS: 1.0000 mg | ORAL_TABLET | Freq: Two times a day (BID) | ORAL | Status: DC | PRN

## 2012-12-28 NOTE — Progress Notes (Signed)
Complaining of less left elbow pain and swelling. Temperature is afebrile and CBC within normal limits. Examination of the left elbow: Range of motion is 5-135. Resolving erythema and no fluctuance. Less pain to palpation. Assessment resolving cellulitis left elbow Plan: No surgical intervention needed. It sounds like he will stop IV antibiotics soon and start by mouth antibiotics. We will sign off for now but happy to see patient again as needed.

## 2012-12-28 NOTE — Progress Notes (Signed)
Discharge instructions given to pt, verbalized understanding. Left the unit in stable condition. 

## 2012-12-28 NOTE — Discharge Summary (Signed)
Physician Discharge Summary  Randy Neal JXB:[id number] DOB: 1975-03-13 DOA: 12/26/2012  PCP: No primary provider on file.  Admit date: 12/26/2012 Discharge date: 12/28/2012  Recommendations for Outpatient Follow-up:  1. Pt will need to follow up with PCP in 2-3 weeks post discharge 2. Please obtain BMP to evaluate electrolytes and kidney function 3. Please also check CBC to evaluate Hg and Hct levels 4. Please note that pt was discharged on Clindamycin ABX to complete therapy for left elbow bursitis 5. Pt was advised to follow up with orthopedic specialist   Discharge Diagnoses:  Olecranon bursts Polysubstance abuse   Discharge Condition: Stable  Diet recommendation: Heart healthy diet discussed in details   Brief narrative:  Patient is 37 year old male who presents to emergency department with main concern of several days duration of left elbow pain associated with swelling and redness. Patient explains he came to emergency department one day prior to this admission and was discharged on antibiotic but his symptoms continued to get worse. He describes pain as persistent and throbbing, 10/10 in severity, nonradiating, localized to left elbow area, no known traumas to the area, no similar events in the past. Patient explains he has been having difficulty with rotation and extending his elbow do to pain. Patient denies systemic symptoms such as chest pain or shortness of breath, no fevers and chills, no abdominal or urinary concerns.  In emergency department, left elbow noted to be more erythematous and swollen compared to yesterday examination. This was noted by emergency room physician who has seen patient yesterday. Orthopedic team was consulted for further evaluation and [facility] asked to admit to medical floor.   Assessment and Plan:  Left olecranon bursitis  - slight improvement, continue Vancomycin day #3 - Please note patient has received dose of clindamycin IV in emergency  department  - Orthopedic's team input appreciated  - MRI of the left elbow confirming bursitis   - continue analgesia as needed for symptom control  - advised follow up with ortho specialist - Clindamycin upon discharge as recommended per ortho  Polysubstance abuse  - UDS positive for opiates, THC, cocaine  - discussed cessation in detail   Consultants:  Ortho  Procedures/Studies:  None Antibiotics:  Vancomycin 11/11 --> 11/3 Clindamycin 11/13 upon discharge   Code Status: Full  Family Communication: Pt at bedside   Discharge Exam: Filed Vitals:   12/28/12 1445  BP: 103/75  Pulse: 65  Temp: 98.5 F (36.9 C)  Resp: 18   Filed Vitals:   12/28/12 0635 12/28/12 0833 12/28/12 1004 12/28/12 1445  BP: 88/49 80/55 101/63 103/75  Pulse: 54 54 63 65  Temp: 97.9 F (36.6 C) 97.8 F (36.6 C) 97.7 F (36.5 C) 98.5 F (36.9 C)  TempSrc: Oral Oral Oral Oral  Resp: 18 18 18 18   Height:      Weight:      SpO2: 93% 96% 99% 99%    General: Pt is alert, follows commands appropriately, not in acute distress Cardiovascular: Regular rate and rhythm, S1/S2 +, no murmurs, no rubs, no gallops Respiratory: Clear to auscultation bilaterally, no wheezing, no crackles, no rhonchi Abdominal: Soft, non tender, non distended, bowel sounds +, no guarding Extremities: no cyanosis, pulses palpable bilaterally DP and PT, left elbow edema and erythema improving  Neuro: Grossly nonfocal  Discharge Instructions  Discharge Orders   Future Orders Complete By Expires   Diet - low sodium heart healthy  As directed    Increase activity slowly  As directed  Medication List    STOP taking these medications       cephALEXin 500 MG capsule  Commonly known as:  KEFLEX     sulfamethoxazole-trimethoprim 800-160 MG per tablet  Commonly known as:  BACTRIM DS,SEPTRA DS      TAKE these medications       buPROPion 150 MG 24 hr tablet  Commonly known as:  WELLBUTRIN XL  Take 150 mg by mouth  daily.     clindamycin 300 MG capsule  Commonly known as:  CLEOCIN  Take 1 capsule (300 mg total) by mouth 3 (three) times daily.     LORazepam 1 MG tablet  Commonly known as:  ATIVAN  Take 1 tablet (1 mg total) by mouth 2 (two) times daily as needed for anxiety.     omeprazole 10 MG capsule  Commonly known as:  PRILOSEC  Take 10 mg by mouth daily.     oxyCODONE-acetaminophen 5-325 MG per tablet  Commonly known as:  PERCOCET/ROXICET  Take 1-2 tablets by mouth every 4 (four) hours as needed for severe pain.     traZODone 100 MG tablet  Commonly known as:  DESYREL  Take 100-300 mg by mouth at bedtime.     VIIBRYD 40 MG Tabs  Generic drug:  Vilazodone HCl  Take 40 mg by mouth daily.           Follow-up Information   Follow up with Debbora Presto, MD In 2 weeks.   Specialty:  Internal Medicine   Contact information:   [address] [place] [address] [phone]        The results of significant diagnostics from this hospitalization (including imaging, microbiology, ancillary and laboratory) are listed below for reference.     Microbiology: Recent Results (from the past 240 hour(s))  CULTURE, BLOOD (ROUTINE X 2)     Status: None   Collection Time    12/26/12 11:10 AM      Result Value Range Status   Specimen Description BLOOD RIGHT ARM   Final   Special Requests BOTTLES DRAWN AEROBIC AND ANAEROBIC 5CC   Final   Culture  Setup Time     Final   Value: 12/26/2012 14:10     Performed at [facility]   Culture     Final   Value:        BLOOD CULTURE RECEIVED NO GROWTH TO DATE CULTURE WILL BE HELD FOR 5 DAYS BEFORE ISSUING A FINAL NEGATIVE REPORT     Performed at [facility]   Report Status PENDING   Incomplete  CULTURE, BLOOD (ROUTINE X 2)     Status: None   Collection Time    12/26/12 11:15 AM      Result Value Range Status   Specimen Description BLOOD RIGHT HAND   Final   Special Requests BOTTLES DRAWN AEROBIC AND ANAEROBIC 5CC    Final   Culture  Setup Time     Final   Value: 12/26/2012 14:10     Performed at [facility]   Culture     Final   Value:        BLOOD CULTURE RECEIVED NO GROWTH TO DATE CULTURE WILL BE HELD FOR 5 DAYS BEFORE ISSUING A FINAL NEGATIVE REPORT     Performed at [facility]   Report Status PENDING   Incomplete     Labs: Basic Metabolic Panel:  Recent Labs Lab 12/26/12 0835 12/27/12 0533 12/28/12 0808  NA 134*  137 139  K 3.8 4.2 4.2  CL 100 102 105  CO2 25 28 28   GLUCOSE 99 124* 113*  BUN 8 14 9   CREATININE 0.93 1.03 0.91  CALCIUM 9.4 9.4 9.2   Liver Function Tests:  Recent Labs Lab 12/26/12 1110  AST 12  ALT 21  ALKPHOS 79  BILITOT 0.4  PROT 6.9  ALBUMIN 3.7   CBC:  Recent Labs Lab 12/26/12 0835 12/27/12 0533 12/28/12 0808  WBC 9.3 5.9 6.2  HGB 14.3 13.5 13.0  HCT 42.3 40.6 38.9*  MCV 90.0 90.8 90.5  PLT 236 230 224   SIGNED: Time coordinating discharge: Over 30 minutes  Debbora Presto, MD  [facility] 12/28/2012, 3:42 PM Pager [phone]  If 7PM-7AM, please contact night-coverage [email or web address] Password [email or web address]

## 2013-01-01 LAB — CULTURE, BLOOD (ROUTINE X 2): Culture: NO GROWTH

## 2013-01-03 ENCOUNTER — Emergency Department (HOSPITAL_COMMUNITY)
Admission: EM | Admit: 2013-01-03 | Discharge: 2013-01-04 | Disposition: A | Discharge status: Other Acute Inpatient Hospital | Payer: Self-pay | Attending: Emergency Medicine | Admitting: Emergency Medicine

## 2013-01-03 ENCOUNTER — Encounter (HOSPITAL_COMMUNITY): Payer: Self-pay | Admitting: Emergency Medicine

## 2013-01-03 DIAGNOSIS — F3289 Other specified depressive episodes: Secondary | ICD-10-CM | POA: Insufficient documentation

## 2013-01-03 DIAGNOSIS — F17200 Nicotine dependence, unspecified, uncomplicated: Secondary | ICD-10-CM | POA: Insufficient documentation

## 2013-01-03 DIAGNOSIS — Z87442 Personal history of urinary calculi: Secondary | ICD-10-CM | POA: Insufficient documentation

## 2013-01-03 DIAGNOSIS — Z79899 Other long term (current) drug therapy: Secondary | ICD-10-CM | POA: Insufficient documentation

## 2013-01-03 DIAGNOSIS — F329 Major depressive disorder, single episode, unspecified: Secondary | ICD-10-CM | POA: Insufficient documentation

## 2013-01-03 DIAGNOSIS — R45851 Suicidal ideations: Secondary | ICD-10-CM | POA: Insufficient documentation

## 2013-01-03 HISTORY — DX: Major depressive disorder, single episode, unspecified: F32.9

## 2013-01-03 HISTORY — DX: Depression, unspecified: F32.A

## 2013-01-03 LAB — COMPREHENSIVE METABOLIC PANEL
ALT: 5 U/L (ref 0–53)
BUN: 15 mg/dL (ref 6–23)
CO2: 25 mEq/L (ref 19–32)
Calcium: 9.9 mg/dL (ref 8.4–10.5)
Creatinine, Ser: 0.98 mg/dL (ref 0.50–1.35)
GFR calc Af Amer: >90 mL/min (ref >90)
GFR calc non Af Amer: >90 mL/min (ref >90)
Glucose, Bld: 93 mg/dL (ref 70–99)
Potassium: 3.8 mEq/L (ref 3.5–5.1)
Sodium: 137 mEq/L (ref 135–145)
Total Protein: 7.2 g/dL (ref 6.0–8.3)

## 2013-01-03 LAB — RAPID URINE DRUG SCREEN, HOSP PERFORMED
Barbiturates: NOT DETECTED
Benzodiazepines: NOT DETECTED
Cocaine: POSITIVE — AB
Opiates: NOT DETECTED

## 2013-01-03 LAB — CBC
Hemoglobin: 13.7 g/dL (ref 13.0–17.0)
MCH: 30.6 pg (ref 26.0–34.0)
MCHC: 34.7 g/dL (ref 30.0–36.0)
MCV: 88.2 fL (ref 78.0–100.0)
RBC: 4.48 MIL/uL (ref 4.22–5.81)

## 2013-01-03 LAB — ETHANOL: Alcohol, Ethyl (B): <11 mg/dL (ref 0–11)

## 2013-01-03 MED ORDER — IBUPROFEN 200 MG PO TABS: 600.0000 mg | ORAL_TABLET | Freq: Three times a day (TID) | ORAL | Status: DC | PRN

## 2013-01-03 MED ORDER — NICOTINE 21 MG/24HR TD PT24
21.0000 mg | MEDICATED_PATCH | Freq: Every day | TRANSDERMAL | Status: DC
  Administered 2013-01-03: 21 mg via TRANSDERMAL
  Filled 2013-01-03: qty 1

## 2013-01-03 MED ORDER — TRAZODONE HCL 100 MG PO TABS
100.0000 mg | ORAL_TABLET | Freq: Every day | ORAL | Status: DC
  Administered 2013-01-03: 100 mg via ORAL
  Filled 2013-01-03: qty 1

## 2013-01-03 MED ORDER — ONDANSETRON HCL 4 MG PO TABS: 4.0000 mg | ORAL_TABLET | Freq: Three times a day (TID) | ORAL | Status: DC | PRN

## 2013-01-03 MED ORDER — LAMOTRIGINE 200 MG PO TABS
200.0000 mg | ORAL_TABLET | Freq: Every day | ORAL | Status: DC
  Filled 2013-01-03: qty 1

## 2013-01-03 MED ORDER — ARIPIPRAZOLE 2 MG PO TABS
2.0000 mg | ORAL_TABLET | Freq: Every day | ORAL | Status: DC
  Filled 2013-01-03: qty 1

## 2013-01-03 MED ORDER — ACETAMINOPHEN 325 MG PO TABS: 650.0000 mg | ORAL_TABLET | ORAL | Status: DC | PRN

## 2013-01-03 MED ORDER — VILAZODONE HCL 40 MG PO TABS
40.0000 mg | ORAL_TABLET | Freq: Every day | ORAL | Status: DC
  Filled 2013-01-03: qty 1

## 2013-01-03 MED ORDER — CLINDAMYCIN HCL 300 MG PO CAPS
300.0000 mg | ORAL_CAPSULE | Freq: Three times a day (TID) | ORAL | Status: DC
  Filled 2013-01-03: qty 1

## 2013-01-03 MED ORDER — BUPROPION HCL ER (XL) 150 MG PO TB24
150.0000 mg | ORAL_TABLET | Freq: Every day | ORAL | Status: DC
  Filled 2013-01-03: qty 1

## 2013-01-03 MED ORDER — DIPHENHYDRAMINE HCL 25 MG PO CAPS
25.0000 mg | ORAL_CAPSULE | Freq: Four times a day (QID) | ORAL | Status: DC | PRN
  Administered 2013-01-03: 25 mg via ORAL
  Filled 2013-01-03: qty 1

## 2013-01-03 MED ORDER — ALUM & MAG HYDROXIDE-SIMETH 200-200-20 MG/5ML PO SUSP: 30.0000 mL | ORAL | Status: DC | PRN

## 2013-01-03 MED ORDER — PANTOPRAZOLE SODIUM 40 MG PO TBEC
40.0000 mg | DELAYED_RELEASE_TABLET | Freq: Every day | ORAL | Status: DC
  Administered 2013-01-03: 40 mg via ORAL
  Filled 2013-01-03: qty 1

## 2013-01-03 MED ORDER — ZOLPIDEM TARTRATE 5 MG PO TABS: 5.0000 mg | ORAL_TABLET | Freq: Every evening | ORAL | Status: DC | PRN

## 2013-01-03 MED ORDER — CLINDAMYCIN HCL 300 MG PO CAPS
300.0000 mg | ORAL_CAPSULE | Freq: Three times a day (TID) | ORAL | Status: DC
  Administered 2013-01-03 – 2013-01-04 (×2): 300 mg via ORAL
  Filled 2013-01-03 (×5): qty 1

## 2013-01-03 MED ORDER — LORAZEPAM 1 MG PO TABS
1.0000 mg | ORAL_TABLET | Freq: Three times a day (TID) | ORAL | Status: DC | PRN
  Administered 2013-01-03: 1 mg via ORAL
  Filled 2013-01-03: qty 1

## 2013-01-03 MED ORDER — LORAZEPAM 1 MG PO TABS
1.0000 mg | ORAL_TABLET | Freq: Once | ORAL | Status: AC
  Administered 2013-01-03: 1 mg via ORAL
  Filled 2013-01-03: qty 1

## 2013-01-03 MED ORDER — OXYCODONE-ACETAMINOPHEN 5-325 MG PO TABS
1.0000 | ORAL_TABLET | ORAL | Status: DC | PRN
  Administered 2013-01-03: 1 via ORAL
  Filled 2013-01-03: qty 1

## 2013-01-03 NOTE — ED Notes (Signed)
PA at bedside.

## 2013-01-03 NOTE — ED Notes (Signed)
Pt states he was sent here by his doctor for feeling suicidal  Pt states she told him he could come voluntarily or she could have him committed  Pt states he has been feeling like this for a while  Pt accompanied by his significant other

## 2013-01-03 NOTE — ED Provider Notes (Signed)
CSN: [id number]     Arrival date & time 01/03/13  1921 History   First MD Initiated Contact with Patient 01/03/13 1922     Chief Complaint  Patient presents with  . Medical Clearance   (Consider location/radiation/quality/duration/timing/severity/associated sxs/prior Treatment) HPI   Randy Neal is a 37 y.o. male sent from psychiatrist's office for suicidal ideation. Patient here voluntarily. Patient has been having suicidal ideation with a plan, states that he wants to shoot himself. States that there are guns in the attic but is not sure if they fire, states that he also has access to firearms but they are in the pawn shop. Patient reports that he is not taking his antidepressant medications as directed. Patient reports that he had a prior suicide attempt as a teenager, in which he overdosed. Patient states he uses recreational marijuana and occasionally cocaine. States he drinks but since he has been becoming more depressed he is actually drinking less. Patient states that he has never had any seizures or hallucinations when he has not had alcohol. States he can be with the on-call for days at a time. Patient denies any homicidal  ideations, audiovisual hallucinations.  Past Medical History  Diagnosis Date  . Kidney stone    Past Surgical History  Procedure Laterality Date  . Kidney stone surgery Right 04/20/1994   History reviewed. No pertinent family history. History  Substance Use Topics  . Smoking status: Light Tobacco Smoker    Types: Cigarettes  . Smokeless tobacco: Never Used  . Alcohol Use: Yes     Comment: everyday    Review of Systems 10 systems reviewed and found to be negative, except as noted in the HPI  Allergies  Doxycycline  Home Medications   Current Outpatient Rx  Name  Route  Sig  Dispense  Refill  . ARIPiprazole (ABILIFY) 2 MG tablet   Oral   Take 2 mg by mouth daily.         Marland Kitchen buPROPion (WELLBUTRIN XL) 150 MG 24 hr tablet   Oral   Take  150 mg by mouth daily.         . clindamycin (CLEOCIN) 300 MG capsule   Oral   Take 300 mg by mouth 3 (three) times daily.         . diphenhydrAMINE (BENADRYL) 25 mg capsule   Oral   Take 25 mg by mouth every 6 (six) hours as needed (itching).         Marland Kitchen lamoTRIgine (LAMICTAL) 200 MG tablet   Oral   Take 200 mg by mouth daily.         Marland Kitchen LORazepam (ATIVAN) 1 MG tablet   Oral   Take 1 tablet (1 mg total) by mouth 2 (two) times daily as needed for anxiety.   60 tablet   0   . omeprazole (PRILOSEC) 10 MG capsule   Oral   Take 10 mg by mouth daily.         Marland Kitchen oxyCODONE-acetaminophen (PERCOCET/ROXICET) 5-325 MG per tablet   Oral   Take 1-2 tablets by mouth every 4 (four) hours as needed for severe pain.   60 tablet   0   . traZODone (DESYREL) 100 MG tablet   Oral   Take 100-300 mg by mouth at bedtime.         . Vilazodone HCl (VIIBRYD) 40 MG TABS   Oral   Take 40 mg by mouth daily.  BP 127/72  Pulse 74  Temp(Src) 98 F (36.7 C) (Oral)  Resp 18  SpO2 97% Physical Exam  Nursing note and vitals reviewed. Constitutional: He is oriented to person, place, and time. He appears well-developed and well-nourished. No distress.  HENT:  Head: Normocephalic.  Mouth/Throat: Oropharynx is clear and moist.  Eyes: Conjunctivae and EOM are normal. Pupils are equal, round, and reactive to light.  Neck: Normal range of motion.  Cardiovascular: Normal rate, regular rhythm and intact distal pulses.   Pulmonary/Chest: Effort normal and breath sounds normal. No stridor. No respiratory distress. He has no wheezes. He has no rales. He exhibits no tenderness.  Abdominal: Soft. Bowel sounds are normal. He exhibits no distension and no mass. There is no tenderness. There is no rebound and no guarding.  Musculoskeletal: Normal range of motion.  Neurological: He is alert and oriented to person, place, and time.  Follows commands, Goal oriented speech, Strength is 5 out of 5x4  extremities, patient ambulates with a coordinated in nonantalgic gait. Sensation is grossly intact.     ED Course  Procedures (including critical care time) Labs Review Labs Reviewed  COMPREHENSIVE METABOLIC PANEL - Abnormal; Notable for the following:    Total Bilirubin 0.1 (*)    All other components within normal limits  URINE RAPID DRUG SCREEN (HOSP PERFORMED) - Abnormal; Notable for the following:    Cocaine POSITIVE (*)    Tetrahydrocannabinol POSITIVE (*)    All other components within normal limits  SALICYLATE LEVEL - Abnormal; Notable for the following:    Salicylate Lvl <2.0 (*)    All other components within normal limits  CBC  ETHANOL  ACETAMINOPHEN LEVEL   Imaging Review No results found.  EKG Interpretation   None       MDM  No diagnosis found.   Filed Vitals:   01/03/13 1926 01/03/13 2206  BP: 127/72 123/70  Pulse: 74 58  Temp: 98 F (36.7 C) 98.3 F (36.8 C)  TempSrc: Oral Oral  Resp: 18 18  SpO2: 97% 97%     Randy Neal is a 37 y.o. male presents to the ED voluntarily for suicidal ideation with a plan to shoot himself. Patient has access to firearms at home. Prior suicide attempt is teenager. Blood work and urinalysis are unremarkable.  Patient is medically cleared for psychiatric evaluation.  Home medications ordered, psych holding orders placed, TTS order placed.   Medications  alum & mag hydroxide-simeth (MAALOX/MYLANTA) 200-200-20 MG/5ML suspension 30 mL (not administered)  ondansetron (ZOFRAN) tablet 4 mg (not administered)  nicotine (NICODERM CQ - dosed in mg/24 hours) patch 21 mg (21 mg Transdermal Patch Applied 01/03/13 2145)  zolpidem (AMBIEN) tablet 5 mg (not administered)  ibuprofen (ADVIL,MOTRIN) tablet 600 mg (not administered)  acetaminophen (TYLENOL) tablet 650 mg (not administered)  LORazepam (ATIVAN) tablet 1 mg (1 mg Oral Given 01/03/13 2210)  ARIPiprazole (ABILIFY) tablet 2 mg (not administered)  buPROPion  (WELLBUTRIN XL) 24 hr tablet 150 mg (not administered)  clindamycin (CLEOCIN) capsule 300 mg (300 mg Oral Given 01/04/13 0022)  diphenhydrAMINE (BENADRYL) capsule 25 mg (25 mg Oral Given 01/03/13 2210)  lamoTRIgine (LAMICTAL) tablet 200 mg (not administered)  pantoprazole (PROTONIX) EC tablet 40 mg (40 mg Oral Given 01/03/13 2144)  oxyCODONE-acetaminophen (PERCOCET/ROXICET) 5-325 MG per tablet 1-2 tablet (1 tablet Oral Given 01/03/13 2209)  traZODone (DESYREL) tablet 100-300 mg (100 mg Oral Given 01/03/13 2144)  Vilazodone HCl (VIIBRYD) TABS 40 mg (not administered)  LORazepam (ATIVAN) tablet 1  mg (1 mg Oral Given 01/03/13 2049)    Note: Portions of this report may have been transcribed using voice recognition software. Every effort was made to ensure accuracy; however, inadvertent computerized transcription errors may be present      Wynetta Emery, PA-C 01/04/13 0131

## 2013-01-04 ENCOUNTER — Encounter (HOSPITAL_COMMUNITY): Payer: Self-pay | Admitting: *Deleted

## 2013-01-04 ENCOUNTER — Inpatient Hospital Stay (HOSPITAL_COMMUNITY)
Admission: EM | Admit: 2013-01-04 | Discharge: 2013-01-07 | DRG: 897 | Disposition: A | Discharge status: Home / Self Care | Payer: Federal, State, Local not specified - Other | Source: Intra-hospital | Attending: Psychiatry | Admitting: Psychiatry

## 2013-01-04 DIAGNOSIS — Z79899 Other long term (current) drug therapy: Secondary | ICD-10-CM

## 2013-01-04 DIAGNOSIS — Z9119 Patient's noncompliance with other medical treatment and regimen: Secondary | ICD-10-CM

## 2013-01-04 DIAGNOSIS — F319 Bipolar disorder, unspecified: Secondary | ICD-10-CM

## 2013-01-04 DIAGNOSIS — F1994 Other psychoactive substance use, unspecified with psychoactive substance-induced mood disorder: Secondary | ICD-10-CM | POA: Diagnosis present

## 2013-01-04 DIAGNOSIS — F1410 Cocaine abuse, uncomplicated: Secondary | ICD-10-CM

## 2013-01-04 DIAGNOSIS — F1220 Cannabis dependence, uncomplicated: Secondary | ICD-10-CM | POA: Diagnosis present

## 2013-01-04 DIAGNOSIS — F12229 Cannabis dependence with intoxication, unspecified: Secondary | ICD-10-CM

## 2013-01-04 DIAGNOSIS — F1420 Cocaine dependence, uncomplicated: Principal | ICD-10-CM | POA: Diagnosis present

## 2013-01-04 DIAGNOSIS — F3130 Bipolar disorder, current episode depressed, mild or moderate severity, unspecified: Secondary | ICD-10-CM

## 2013-01-04 DIAGNOSIS — F14120 Cocaine abuse with intoxication, uncomplicated: Secondary | ICD-10-CM | POA: Diagnosis present

## 2013-01-04 DIAGNOSIS — R45851 Suicidal ideations: Secondary | ICD-10-CM

## 2013-01-04 MED ORDER — ALUM & MAG HYDROXIDE-SIMETH 200-200-20 MG/5ML PO SUSP: 30.0000 mL | ORAL | Status: DC | PRN

## 2013-01-04 MED ORDER — TRAZODONE HCL 50 MG PO TABS
50.0000 mg | ORAL_TABLET | Freq: Every evening | ORAL | Status: DC | PRN
  Administered 2013-01-04 – 2013-01-06 (×4): 50 mg via ORAL
  Filled 2013-01-04: qty 28
  Filled 2013-01-04 (×5): qty 1
  Filled 2013-01-04 (×3): qty 28
  Filled 2013-01-04 (×5): qty 1

## 2013-01-04 MED ORDER — HYDROXYZINE HCL 50 MG PO TABS
50.0000 mg | ORAL_TABLET | Freq: Every day | ORAL | Status: DC
  Administered 2013-01-04 – 2013-01-06 (×3): 50 mg via ORAL
  Filled 2013-01-04 (×4): qty 1
  Filled 2013-01-04 (×2): qty 14
  Filled 2013-01-04 (×2): qty 1

## 2013-01-04 MED ORDER — CLONAZEPAM 0.5 MG PO TABS: 0.5000 mg | ORAL_TABLET | Freq: Two times a day (BID) | ORAL | Status: DC

## 2013-01-04 MED ORDER — MAGNESIUM HYDROXIDE 400 MG/5ML PO SUSP: 30.0000 mL | Freq: Every day | ORAL | Status: DC | PRN

## 2013-01-04 MED ORDER — TRAMADOL HCL 50 MG PO TABS
50.0000 mg | ORAL_TABLET | Freq: Four times a day (QID) | ORAL | Status: AC
  Administered 2013-01-04 – 2013-01-05 (×2): 50 mg via ORAL
  Filled 2013-01-04 (×3): qty 1

## 2013-01-04 MED ORDER — LORAZEPAM 1 MG PO TABS
1.0000 mg | ORAL_TABLET | Freq: Four times a day (QID) | ORAL | Status: DC | PRN
  Administered 2013-01-04 – 2013-01-06 (×3): 1 mg via ORAL
  Filled 2013-01-04: qty 1
  Filled 2013-01-04: qty 2
  Filled 2013-01-04: qty 1

## 2013-01-04 MED ORDER — BUPROPION HCL ER (XL) 150 MG PO TB24
150.0000 mg | ORAL_TABLET | Freq: Every day | ORAL | Status: DC
  Administered 2013-01-04 – 2013-01-07 (×4): 150 mg via ORAL
  Filled 2013-01-04 (×6): qty 1
  Filled 2013-01-04 (×2): qty 14
  Filled 2013-01-04: qty 1

## 2013-01-04 MED ORDER — ACETAMINOPHEN 325 MG PO TABS
650.0000 mg | ORAL_TABLET | Freq: Four times a day (QID) | ORAL | Status: DC | PRN
Start: 2013-01-04 — End: 2013-01-07
  Administered 2013-01-04 – 2013-01-06 (×4): 650 mg via ORAL
  Filled 2013-01-04 (×4): qty 2

## 2013-01-04 NOTE — BH Assessment (Signed)
Assessment Note  Randy Neal is a 37 y.o. male who presents at the request of his psychiatrist--Dr. [facility]).  Pt is accompanied by his spouse.  Pt is voluntary and states that he's been "battling depression all my life".  Pt is SI w/plan to shoot self; has been feeling SI x 1 month and has 1 past SI attempt hx by overdose. Pt   Pt repots triggers: lost job 3 wks ago, financial problems and relational issues at home.  Pt has only 1 past inpt admission with [facility], 20 yrs ago.   Pt admits using alcohol since his teens and drinks 2-3x's a week, 1-5 beers and last drank 4-5 days ago.  Pt uses 1 bowl of THC occasionally, last use was 12/30/12.  Pt also used cocaine recently at a wedding reception.  Pt endorses depression: crying spells, racing thoughts, insomnia(5 hrs, daily) and isolates self.  Pt says when his family asks if he feels like hurting himself, he responds--"not today', but tells this writer--I can't say I'll be around on Friday".  Pt accepted for treatment by Sheran Fava; 500-1.    Axis I: Bipolar, Depressed and Substance Abuse Axis II: Deferred Axis III:  Past Medical History  Diagnosis Date  . Kidney stone   . Depression    Axis IV: economic problems, occupational problems, other psychosocial or environmental problems, problems related to social environment and problems with primary support group Axis V: 31-40 impairment in reality testing  Past Medical History:  Past Medical History  Diagnosis Date  . Kidney stone   . Depression     Past Surgical History  Procedure Laterality Date  . Kidney stone surgery Right 04/20/1994    Family History: History reviewed. No pertinent family history.  Social History:  reports that he has been smoking Cigarettes.  He has been smoking about 0.00 packs per day. He has never used smokeless tobacco. He reports that he drinks alcohol. He reports that he uses illicit drugs (Marijuana and  Cocaine).  Additional Social History:  Alcohol / Drug Use Pain Medications: See MAR  Prescriptions: See MAR  Over the Counter: See MAR  History of alcohol / drug use?: Yes Longest period of sobriety (when/how long): None  Negative Consequences of Use: Work / School;Personal relationships;Financial Withdrawal Symptoms: Other (Comment) (No current w/d sxs ) Substance #1 Name of Substance 1: Alcohol  1 - Age of First Use: Teens  1 - Amount (size/oz): Beer  1 - Frequency: 2-3x's Wkly  1 - Duration: On-going  1 - Last Use / Amount: 4-5 Days Ago  Substance #2 Name of Substance 2: THC  2 - Age of First Use: Teens  2 - Amount (size/oz): 1 Bowl  2 - Frequency: Occasionally  2 - Duration: On-going  2 - Last Use / Amount: 12/30/12 Substance #3 Name of Substance 3: Cocaine  3 - Age of First Use: Unk  3 - Amount (size/oz): Unk  3 - Frequency: Unk  3 - Duration: On-going  3 - Last Use / Amount: Pt reports last use was in November 2014 at a wedding reception.   CIWA: CIWA-Ar BP: 105/67 mmHg Pulse Rate: 58 COWS:    Allergies:  Allergies  Allergen Reactions  . Doxycycline Rash    Home Medications:  (Not in a hospital admission)  OB/GYN Status:  No LMP for male patient.  General Assessment Data Location of Assessment: [facility] Is this a Tele or Face-to-Face Assessment?: Tele Assessment Is this  an Initial Assessment or a Re-assessment for this encounter?: Initial Assessment Living Arrangements: Spouse/significant other;Children Can pt return to current living arrangement?: Yes Admission Status: Voluntary Is patient capable of signing voluntary admission?: Yes Transfer from: Acute Hospital Referral Source: MD  Medical Screening Exam [facility] Walk-in ONLY) Medical Exam completed: No Reason for MSE not completed: Other: (None )  [facility] Crisis Care Plan Living Arrangements: Spouse/significant other;Children Name of Psychiatrist: None  Name of Therapist: None   Education Status Is  patient currently in school?: No Current Grade: None  Highest grade of school patient has completed: None  Name of school: None  Contact person: None   Risk to self Suicidal Ideation: Yes-Currently Present Suicidal Intent: Yes-Currently Present Is patient at risk for suicide?: Yes Suicidal Plan?: Yes-Currently Present Specify Current Suicidal Plan: Shoot self w/gun  Access to Means: Yes Specify Access to Suicidal Means: Guns  What has been your use of drugs/alcohol within the last 12 months?: Abusing: alcohol, THC, cocaine  Previous Attempts/Gestures: Yes How many times?: 1 Other Self Harm Risks: None  Triggers for Past Attempts: Unpredictable Intentional Self Injurious Behavior: None Family Suicide History: No Recent stressful life event(s): Job Loss;Financial Problems;Other (Comment) (Relational issues ) Persecutory voices/beliefs?: No Depression: Yes Depression Symptoms: Insomnia;Isolating;Tearfulness;Loss of interest in usual pleasures;Feeling worthless/self pity Substance abuse history and/or treatment for substance abuse?: Yes Suicide prevention information given to non-admitted patients: Not applicable  Risk to Others Homicidal Ideation: No Thoughts of Harm to Others: No Current Homicidal Intent: No Current Homicidal Plan: No Access to Homicidal Means: No Identified Victim: None  History of harm to others?: No Assessment of Violence: None Noted Violent Behavior Description: None  Does patient have access to weapons?: No Criminal Charges Pending?: No Does patient have a court date: No  Psychosis Hallucinations: None noted Delusions: None noted  Mental Status Report Appear/Hygiene: Disheveled Eye Contact: Good Motor Activity: Unremarkable Speech: Logical/coherent Level of Consciousness: Alert Mood: Depressed;Sad Affect: Depressed;Sad;Appropriate to circumstance Anxiety Level: None Thought Processes: Coherent;Relevant Judgement: Impaired Orientation:  Person;Place;Time;Situation Obsessive Compulsive Thoughts/Behaviors: None  Cognitive Functioning Concentration: Normal Memory: Recent Intact;Remote Intact IQ: Average Insight: Poor Impulse Control: Poor Appetite: Good Weight Loss: 0 Weight Gain: 0 Sleep: Decreased Total Hours of Sleep: 5 Vegetative Symptoms: None  ADLScreening [facility] Assessment Services) Patient's cognitive ability adequate to safely complete daily activities?: Yes Patient able to express need for assistance with ADLs?: Yes Independently performs ADLs?: Yes (appropriate for developmental age)  Prior Inpatient Therapy Prior Inpatient Therapy: Yes Prior Therapy Dates: 20 yrs ago  Prior Therapy Facilty/Provider(s): [facility]  Reason for Treatment: Depression/SI   Prior Outpatient Therapy Prior Outpatient Therapy: No Prior Therapy Dates: None  Prior Therapy Facilty/Provider(s): None  Reason for Treatment: None   ADL Screening (condition at time of admission) Patient's cognitive ability adequate to safely complete daily activities?: Yes Is the patient deaf or have difficulty hearing?: No Does the patient have difficulty seeing, even when wearing glasses/contacts?: No Does the patient have difficulty concentrating, remembering, or making decisions?: No Patient able to express need for assistance with ADLs?: Yes Does the patient have difficulty dressing or bathing?: No Independently performs ADLs?: Yes (appropriate for developmental age) Does the patient have difficulty walking or climbing stairs?: No Weakness of Legs: None Weakness of Arms/Hands: None  Home Assistive Devices/Equipment Home Assistive Devices/Equipment: None  Therapy Consults (therapy consults require a physician order) PT Evaluation Needed: No OT Evalulation Needed: No SLP Evaluation Needed: No Abuse/Neglect Assessment (Assessment to be complete while patient is  alone) Physical Abuse: Denies Verbal Abuse: Denies Sexual Abuse:  Denies Exploitation of patient/patient's resources: Denies Self-Neglect: Denies Values / Beliefs Cultural Requests During Hospitalization: None Spiritual Requests During Hospitalization: None Consults Spiritual Care Consult Needed: No Social Work Consult Needed: No Merchant navy officer (For Healthcare) Advance Directive: Patient does not have advance directive;Patient would not like information Pre-existing out of facility DNR order (yellow form or pink MOST form): No Nutrition Screen- [facility] Adult/[facility] Patient's home diet: Regular  Additional Information 1:1 In Past 12 Months?: No CIRT Risk: No Elopement Risk: No Does patient have medical clearance?: Yes     Disposition:  Disposition Initial Assessment Completed for this Encounter: Yes Disposition of Patient: Inpatient treatment program;Referred to (Accepted by Donell Sievert, PA; 500-1) Type of inpatient treatment program: Adult Patient referred to: Other (Comment) (Accepted by Donell Sievert, PA ;500-1)  On Site Evaluation by:   Reviewed with Physician:    Murrell Redden 01/04/2013 2:33 AM

## 2013-01-04 NOTE — Progress Notes (Signed)
Adult Psychoeducational Group Note  Date:  01/04/2013 Time:  11:00AM Group Topic/Focus:  Leisure and Lifestyle Changes  Participation Level:  Did Not Attend    Additional Comments:  Pt. Didn't attend group.   Bing Plume D 01/04/2013, 11:54 AM

## 2013-01-04 NOTE — ED Provider Notes (Signed)
Medical screening examination/treatment/procedure(s) were performed by non-physician practitioner and as supervising physician I was immediately available for consultation/collaboration.  EKG Interpretation   None         Randy Manfred F Victorio Creeden, MD 01/04/13 1241 

## 2013-01-04 NOTE — [facility] Suicide Risk Assessment (Signed)
Suicide Risk Assessment  Admission Assessment     Nursing information obtained from:  Patient Demographic factors:  Male;Caucasian;Unemployed;Access to firearms Current Mental Status:  Self-harm thoughts Loss Factors:  Financial problems / change in socioeconomic status Historical Factors:  Prior suicide attempts Risk Reduction Factors:  Sense of responsibility to family;Positive therapeutic relationship;Positive social support  CLINICAL FACTORS:   Severe Anxiety and/or Agitation Bipolar Disorder:   Depressive phase Depression:   Aggression Anhedonia Comorbid alcohol abuse/dependence Hopelessness Impulsivity Insomnia Recent sense of peace/wellbeing Severe Alcohol/Substance Abuse/Dependencies Unstable or Poor Therapeutic Relationship Previous Psychiatric Diagnoses and Treatments Medical Diagnoses and Treatments/Surgeries  COGNITIVE FEATURES THAT CONTRIBUTE TO RISK:  Closed-mindedness Loss of executive function Polarized thinking Thought constriction (tunnel vision)    SUICIDE RISK:   Moderate:  Frequent suicidal ideation with limited intensity, and duration, some specificity in terms of plans, no associated intent, good self-control, limited dysphoria/symptomatology, some risk factors present, and identifiable protective factors, including available and accessible social support.  PLAN OF CARE: admit for crisis stabilization, safety monitoring and medication management.   I certify that inpatient services furnished can reasonably be expected to improve the patient's condition.  Franshesca Chipman,JANARDHAHA R. 01/04/2013, 11:43 AM

## 2013-01-04 NOTE — [facility] Suicide Risk Assessment (Signed)
[facility] INPATIENT:  Family/Significant Other Suicide Prevention Education  Suicide Prevention Education:  Education Completed; Robby Sermon - girlfriend [phone]),  (name of family member/significant other) has been identified by the patient as the family member/significant other with whom the patient will be residing, and identified as the person(s) who will aid the patient in the event of a mental health crisis (suicidal ideations/suicide attempt).  With written consent from the patient, the family member/significant other has been provided the following suicide prevention education, prior to the and/or following the discharge of the patient.  The suicide prevention education provided includes the following:  Suicide risk factors  Suicide prevention and interventions  National Suicide Hotline telephone number  [facility] assessment telephone number  [facility] Emergency Assistance 911  [facility] and/or Residential Mobile Crisis Unit telephone number  Request made of family/significant other to:  Remove weapons (e.g., guns, rifles, knives), all items previously/currently identified as safety concern.    Remove drugs/medications (over-the-counter, prescriptions, illicit drugs), all items previously/currently identified as a safety concern.  The family member/significant other verbalizes understanding of the suicide prevention education information provided.  The family member/significant other agrees to remove the items of safety concern listed above.  Carmina Miller 01/04/2013, 10:12 AM

## 2013-01-04 NOTE — Progress Notes (Signed)
Patient ID: Randy Neal, male   DOB: 08/16/1975, 37 y.o.   MRN: [id number] Patient has been moved to bed 302-01. Patient was given PRN Ativan and rates his anxiety 6/10. Q15 minute safety checks are maintained. Patient is safe at this time.

## 2013-01-04 NOTE — H&P (Signed)
Psychiatric Admission Assessment Adult  Patient Identification:  Randy Neal Date of Evaluation:  01/04/2013 Chief Complaint:  Bipolar Disorder History of Present Illness: Randy Neal is a 37 y.o. Single caucasian male admitted voluntarily and emergently from [facility] for depression and suicidal ideation with plan of using gun to shoot himself. Reportedly he was referred by his psychiatrist--Dr. Ezzard Flax [facility]). Patient is accompanied by his spouse/girl friend of 10 years. He states that he's been "battling depression all my life". He has been feeling SI x 1 month and has 1 past SI attempt hx by overdose. He repots triggers: lost job 3 wks ago, financial problems and relational issues at home. He has only 1 past inpt admission with [facility], 20 yrs ago.  He admits using alcohol since his teens and drinks 2-3x's a week, 1-5 beers and last drank 4-5 days ago. He uses 1 bowl of THC occasionally, last use was 12/30/12. Pt also used cocaine recently at a wedding reception. He endorses depression: crying spells, racing thoughts, insomnia(5 hrs, daily) and isolates self. Pt says when his family asks if he feels like hurting himself, he responds--"not today', but tells this writer--I can't say I'll be around on Friday". His UDS is positive for cocaine and marijuana.  Elements:  Location:  [facility] adult unit. Quality:  depression. Severity:  suicidal. Timing:  substance abuse and non compliance. Duration:  few weeks. Context:  lost job. Associated Signs/Synptoms: Depression Symptoms:  depressed mood, anhedonia, insomnia, psychomotor retardation, fatigue, feelings of worthlessness/guilt, difficulty concentrating, hopelessness, recurrent thoughts of death, panic attacks, weight loss, decreased labido, decreased appetite, (Hypo) Manic Symptoms:  Elevated Mood, Impulsivity, Irritable Mood, Labiality of Mood, Anxiety Symptoms:  Excessive Worry, Psychotic Symptoms:   denied PTSD Symptoms: NA  Psychiatric Specialty Exam: Physical Exam  ROS  Blood pressure 131/81, pulse 60, temperature 97.3 F (36.3 C), temperature source Oral, resp. rate 20, height 5\' 8"  (1.727 m), weight 87.544 kg (193 lb).Body mass index is 29.35 kg/(m^2).  General Appearance: Disheveled  Eye Solicitor::  Fair  Speech:  Clear and Coherent  Volume:  Normal  Mood:  Anxious, Depressed, Hopeless and Worthless  Affect:  Depressed and Flat  Thought Process:  Goal Directed and Intact  Orientation:  Full (Time, Place, and Person)  Thought Content:  WDL  Suicidal Thoughts:  Yes.  with intent/plan  Homicidal Thoughts:  No  Memory:  Immediate;   Fair  Judgement:  Impaired  Insight:  Lacking  Psychomotor Activity:  Psychomotor Retardation  Concentration:  Fair  Recall:  NA  Akathisia:  NA  Handed:  Right  AIMS (if indicated):     Assets:  Communication Skills Desire for Improvement Housing Intimacy Physical Health Resilience Social Support Transportation  Sleep:  Number of Hours: 1.5    Past Psychiatric History: Diagnosis: Bipolar disorder, Substance abuse  Hospitalizations: yes  Outpatient Care:yes  Substance Abuse Care:yes  Self-Mutilation:no  Suicidal Attempts:yes  Violent Behaviors: no   Past Medical History:   Past Medical History  Diagnosis Date  . Kidney stone   . Depression    None. Allergies:   Allergies  Allergen Reactions  . Doxycycline Rash   PTA Medications: Prescriptions prior to admission  Medication Sig Dispense Refill  . buPROPion (WELLBUTRIN XL) 150 MG 24 hr tablet Take 150 mg by mouth daily.      . clindamycin (CLEOCIN) 300 MG capsule Take 300 mg by mouth 3 (three) times daily.      . diphenhydrAMINE (BENADRYL) 25  mg capsule Take 25 mg by mouth every 6 (six) hours as needed (itching).      . LORazepam (ATIVAN) 1 MG tablet Take 1 tablet (1 mg total) by mouth 2 (two) times daily as needed for anxiety.  60 tablet  0  . omeprazole (PRILOSEC)  10 MG capsule Take 10 mg by mouth daily.      Marland Kitchen oxyCODONE-acetaminophen (PERCOCET/ROXICET) 5-325 MG per tablet Take 1-2 tablets by mouth every 4 (four) hours as needed for severe pain.  60 tablet  0  . traZODone (DESYREL) 100 MG tablet Take 100-300 mg by mouth at bedtime.      . Vilazodone HCl (VIIBRYD) 40 MG TABS Take 40 mg by mouth daily.      . ARIPiprazole (ABILIFY) 2 MG tablet Take 2 mg by mouth daily.      Marland Kitchen lamoTRIgine (LAMICTAL) 200 MG tablet Take 200 mg by mouth daily.        Previous Psychotropic Medications:  Medication/Dose  Seroquel  Abilify  Lamictal  Wellbutrin  Viibryd  trazodone     Substance Abuse History in the last 12 months:  yes  Consequences of Substance Abuse: NA  Social History:  reports that he has been smoking Cigarettes.  He has a 7.5 pack-year smoking history. He has never used smokeless tobacco. He reports that he drinks alcohol. He reports that he uses illicit drugs (Marijuana and Cocaine). Additional Social History:                      Current Place of Residence:   Place of Birth:   Family Members: Marital Status:  Single Children:  Sons:  Daughters: Relationships: Education:  [employer] Problems/Performance: Religious Beliefs/Practices: History of Abuse (Emotional/Phsycial/Sexual) Teacher, music History:  None. Legal History: Hobbies/Interests:  Family History:  History reviewed. No pertinent family history.  Results for orders placed during the hospital encounter of 01/03/13 (from the past 72 hour(s))  URINE RAPID DRUG SCREEN (HOSP PERFORMED)     Status: Abnormal   Collection Time    01/03/13  8:00 PM      Result Value Range   Opiates NONE DETECTED  NONE DETECTED   Cocaine POSITIVE (*) NONE DETECTED   Benzodiazepines NONE DETECTED  NONE DETECTED   Amphetamines NONE DETECTED  NONE DETECTED   Tetrahydrocannabinol POSITIVE (*) NONE DETECTED   Barbiturates NONE DETECTED  NONE DETECTED    Comment:            DRUG SCREEN FOR MEDICAL PURPOSES     ONLY.  IF CONFIRMATION IS NEEDED     FOR ANY PURPOSE, NOTIFY LAB     WITHIN 5 DAYS.                LOWEST DETECTABLE LIMITS     FOR URINE DRUG SCREEN     Drug Class       Cutoff (ng/mL)     Amphetamine      1000     Barbiturate      200     Benzodiazepine   200     Tricyclics       300     Opiates          300     Cocaine          300     THC              50  CBC     Status: None  Collection Time    01/03/13  8:13 PM      Result Value Range   WBC 9.3  4.0 - 10.5 K/uL   RBC 4.48  4.22 - 5.81 MIL/uL   Hemoglobin 13.7  13.0 - 17.0 g/dL   HCT 16.1  09.6 - 04.5 %   MCV 88.2  78.0 - 100.0 fL   MCH 30.6  26.0 - 34.0 pg   MCHC 34.7  30.0 - 36.0 g/dL   RDW 40.9  81.1 - 91.4 %   Platelets 344  150 - 400 K/uL  COMPREHENSIVE METABOLIC PANEL     Status: Abnormal   Collection Time    01/03/13  8:13 PM      Result Value Range   Sodium 137  135 - 145 mEq/L   Potassium 3.8  3.5 - 5.1 mEq/L   Chloride 101  96 - 112 mEq/L   CO2 25  19 - 32 mEq/L   Glucose, Bld 93  70 - 99 mg/dL   BUN 15  6 - 23 mg/dL   Creatinine, Ser 7.82  0.50 - 1.35 mg/dL   Calcium 9.9  8.4 - 95.6 mg/dL   Total Protein 7.2  6.0 - 8.3 g/dL   Albumin 3.9  3.5 - 5.2 g/dL   AST 5  0 - 37 U/L   ALT 5  0 - 53 U/L   Alkaline Phosphatase 88  39 - 117 U/L   Total Bilirubin 0.1 (*) 0.3 - 1.2 mg/dL   GFR calc non Af Amer >90  >90 mL/min   GFR calc Af Amer >90  >90 mL/min   Comment: (NOTE)     The eGFR has been calculated using the CKD EPI equation.     This calculation has not been validated in all clinical situations.     eGFR's persistently <90 mL/min signify possible Chronic Kidney     Disease.  ETHANOL     Status: None   Collection Time    01/03/13  8:13 PM      Result Value Range   Alcohol, Ethyl (B) <11  0 - 11 mg/dL   Comment:            LOWEST DETECTABLE LIMIT FOR     SERUM ALCOHOL IS 11 mg/dL     FOR MEDICAL PURPOSES ONLY  SALICYLATE LEVEL      Status: Abnormal   Collection Time    01/03/13  8:13 PM      Result Value Range   Salicylate Lvl <2.0 (*) 2.8 - 20.0 mg/dL  ACETAMINOPHEN LEVEL     Status: None   Collection Time    01/03/13  8:13 PM      Result Value Range   Acetaminophen (Tylenol), Serum <15.0  10 - 30 ug/mL   Comment:            THERAPEUTIC CONCENTRATIONS VARY     SIGNIFICANTLY. A RANGE OF 10-30     ug/mL MAY BE AN EFFECTIVE     CONCENTRATION FOR MANY PATIENTS.     HOWEVER, SOME ARE BEST TREATED     AT CONCENTRATIONS OUTSIDE THIS     RANGE.     ACETAMINOPHEN CONCENTRATIONS     >150 ug/mL AT 4 HOURS AFTER     INGESTION AND >50 ug/mL AT 12     HOURS AFTER INGESTION ARE     OFTEN ASSOCIATED WITH TOXIC     REACTIONS.   Psychological Evaluations:  Assessment:   DSM5:  Schizophrenia Disorders:   Obsessive-Compulsive Disorders:   Trauma-Stressor Disorders:   Substance/Addictive Disorders:  Cannabis Use Disorder - Severe (304.30) Depressive Disorders:  Major Depressive Disorder - Unspecified (296.20)  AXIS I:  Bipolar, Depressed, Substance Induced Mood Disorder and Cannabis dependence and cocaine intoxication; medication non compliance AXIS II:  Deferred AXIS III:   Past Medical History  Diagnosis Date  . Kidney stone   . Depression    AXIS IV:  economic problems, occupational problems, other psychosocial or environmental problems, problems related to social environment and problems with primary support group AXIS V:  41-50 serious symptoms  Treatment Plan/Recommendations:  Admit for crisis stabilization and safety monitoring and medication management.  Treatment Plan Summary: Daily contact with patient to assess and evaluate symptoms and progress in treatment Medication management Current Medications:  Current Facility-Administered Medications  Medication Dose Route Frequency Provider Last Rate Last Dose  . acetaminophen (TYLENOL) tablet 650 mg  650 mg Oral Q6H PRN Kerry Hough, PA-C      . alum  & mag hydroxide-simeth (MAALOX/MYLANTA) 200-200-20 MG/5ML suspension 30 mL  30 mL Oral Q4H PRN Kerry Hough, PA-C      . buPROPion (WELLBUTRIN XL) 24 hr tablet 150 mg  150 mg Oral Daily Nehemiah Settle, MD      . hydrOXYzine (ATARAX/VISTARIL) tablet 50 mg  50 mg Oral QHS Nehemiah Settle, MD      . magnesium hydroxide (MILK OF MAGNESIA) suspension 30 mL  30 mL Oral Daily PRN Kerry Hough, PA-C      . traZODone (DESYREL) tablet 50 mg  50 mg Oral QHS,MR X 1 Kerry Hough, PA-C        Observation Level/Precautions:  15 minute checks  Laboratory:  Reviewed admission labs  Psychotherapy:  Individual, group, substance abuse counseling and milieu therapy  Medications:  Wellbutrin, vistaril and trazodone  Consultations:  none  Discharge Concerns:  safety  Estimated LOS: 4-7 days  Other:     I certify that inpatient services furnished can reasonably be expected to improve the patient's condition.   Vennessa Affinito,JANARDHAHA R. 11/20/201411:44 AM

## 2013-01-04 NOTE — Progress Notes (Signed)
Adult Psychoeducational Group Note  Date:  01/04/2013 Time:  10:00AM Group Topic/Focus:  Therapeutic Activity  Participation Level:  Did Not Attend   Additional Comments: Pt. Didn't attend group.   Bing Plume D 01/04/2013, 10:04 AM

## 2013-01-04 NOTE — Progress Notes (Signed)
Patient ID: Randy Neal, male   DOB: [birth year], 37 y.o.   MRN: [id number] He has been up to part of the groups "said that he did not gett here till late in the night and he wanted to sleep.  He denies thought to harm self today. He requested and receive prn for left elbow pain. He has been pleasant and cooperative.  Has been encouraged to attend groups.
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
## 2013-01-04 NOTE — Progress Notes (Signed)
Patient ID: Randy Neal, male   DOB: Oct 21, 1975, 37 y.o.   MRN: [id number]   Pt was pleasant and cooperative during the adm process. Stated he's been battling depression for apprx 20 yrs." Stated that "things have been happening in the last few months, and his wife talked him into going to his psychiatrist today." Pt stated his wife told him that she would feel better if he was to get help. "Sometimes you can't do it by yourself".  Pt states he needs to be home by Thanksgiving because he hadn't been home for Thanksgiving in 3 yrs. Pt denied SI, HI, A/V.

## 2013-01-04 NOTE — [facility] Group Notes (Addendum)
[facility] LCSW Group Therapy  01/04/2013  1:15 PM   Type of Therapy:  Group Therapy  Participation Level:  Active  Participation Quality:  Attentive and Supportive  Affect:  Depressed and Flat  Cognitive:  Alert and Oriented  Insight:  Developing/Improving, Engaged and Supportive  Engagement in Therapy:  Developing/Improving, Engaged and Supportive  Modes of Intervention:  Activity, Clarification, Confrontation, Discussion, Education, Exploration, Limit-setting, Orientation, Problem-solving, Rapport Building, Dance movement psychotherapist, Socialization and Support  Summary of Progress/Problems: Patient was attentive and engaged with speaker from [employer].  Patient was attentive to speaker while they shared their story of dealing with mental health and overcoming it.  Patient expressed interest in their programs and services and received information on their agency.  Patient processed ways they can relate to the speaker.   Pt was able to process his feelings around not being able to provide for his family right now and feelings of guilt, due to being here and having to care for himself, instead if his family.  Pt was able to relate to his peers and speaker on how to balance caring for himself or providing for his family.    Onelia Cadmus Horton, LCSW 01/04/2013 1:25 PM

## 2013-01-04 NOTE — Tx Team (Signed)
Initial Interdisciplinary Treatment Plan  PATIENT STRENGTHS: (choose at least two) General fund of knowledge Supportive family/friends  PATIENT STRESSORS: Financial difficulties   PROBLEM LIST: Problem List/Patient Goals Date to be addressed Date deferred Reason deferred Estimated date of resolution  Risk For Suicide      Unemployed      Decline in Physical health                                           DISCHARGE CRITERIA:  Improved stabilization in mood, thinking, and/or behavior  PRELIMINARY DISCHARGE PLAN: Attend aftercare/continuing care group Attend PHP/IOP Outpatient therapy  PATIENT/FAMIILY INVOLVEMENT: This treatment plan has been presented to and reviewed with the patient, Randy Neal.  The patient and family have been given the opportunity to ask questions and make suggestions.  Jacques Navy A 01/04/2013, 3:30 AM

## 2013-01-04 NOTE — [facility] Counselor (Signed)
Adult Comprehensive Assessment  Patient ID: Randy Neal, male   DOB: [birth year], 37 y.o.   MRN: [id number]  Information Source: Information source: Patient  Current Stressors:  Educational / Learning stressors: N/A Employment / Job issues: recently loss job 2 weeks Family Relationships: N/A Surveyor, quantity / Lack of resources (include bankruptcy): no income due to losing job, main Conservation officer, historic buildings / Lack of housing: N/A Physical health (include injuries & life threatening diseases): N/A Social relationships: N/A Substance abuse: N/A Bereavement / Loss: N/A  Living/Environment/Situation:  Living Arrangements: Spouse/significant other Living conditions (as described by patient or guardian): Pt lives with his girlfriend and kids in [place].  Pt reports this is a great environment.  How long has patient lived in current situation?: 7 years What is atmosphere in current home: Supportive;Loving;Comfortable  Family History:  Marital status: Long term relationship Long term relationship, how long?: 10 years What types of issues is patient dealing with in the relationship?: pt denies any major issues Additional relationship information: N/A Does patient have children?: Yes How many children?: 3 How is patient's relationship with their children?: Pt reports having a good relationship with his children, 1 bio, 2 step children but calls his own  Childhood History:  By whom was/is the patient raised?: Both parents Additional childhood history information: Parents were divorced, states he raised himself after 37 yrs old.  Pt states that he had an okay childhood.  Description of patient's relationship with caregiver when they were a child: Pt reports getting along better with mother growing up.  Patient's description of current relationship with people who raised him/her: Father passed away, was close to him during his adulthood.  No relationship with mother now due to not agreeing with  the choices she's made.   Does patient have siblings?: Yes Number of Siblings: 1 Description of patient's current relationship with siblings: Pt reports being very close to his sister today.   Did patient suffer any verbal/emotional/physical/sexual abuse as a child?: No Did patient suffer from severe childhood neglect?: No Has patient ever been sexually abused/assaulted/raped as an adolescent or adult?: No Was the patient ever a victim of a crime or a disaster?: No Witnessed domestic violence?: No Has patient been effected by domestic violence as an adult?: No  Education:  Highest grade of school patient has completed: graduated high school Currently a Consulting civil engineer?: No Learning disability?: No  Employment/Work Situation:   Employment situation: Unemployed Patient's job has been impacted by current illness: No What is the longest time patient has a held a job?: 14 years 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 Has patient ever been in the [employer]?: No Has patient ever served in Buyer, retail?: No  Financial Resources:   Surveyor, quantity resources: No income Does patient have a Lawyer or guardian?: No  Alcohol/Substance Abuse:   What has been your use of drugs/alcohol within the last 12 months?: Pt denies alcohol and drug abuse If attempted suicide, did drugs/alcohol play a role in this?: No Alcohol/Substance Abuse Treatment Hx: Denies past history If yes, describe treatment: N/A Has alcohol/substance abuse ever caused legal problems?: No  Social Support System:   Conservation officer, nature Support System: Poor Describe Community Support System: Pt is not sure who is supportive anymore.   Type of faith/religion: Agnostic How does patient's faith help to cope with current illness?: None reported  Leisure/Recreation:   Leisure and Hobbies: ride motorcycles  Strengths/Needs:   What things does the patient do well?:  pt states that up until now  he has been a provider.   In what areas does patient struggle / problems for patient: Depression, SI  Discharge Plan:   Does patient have access to transportation?: Yes Will patient be returning to same living situation after discharge?: Yes Currently receiving community mental health services: Yes (From Whom) ([facility] for medication management) If no, would patient like referral for services when discharged?: Yes (What county?) [place]) Does patient have financial barriers related to discharge medications?: No  Summary/Recommendations:     Patient is a 37 year old Caucasian Male with a diagnosis of Bipolar Disorder - Depressed and Substance Abuse.  Patient lives in [place] with his girlfriend.  Pt states that he has been down and depressed due to losing his job 2 weeks ago and having no income.  Patient will benefit from crisis stabilization, medication evaluation, group therapy and psycho education in addition to case management for discharge planning.    Horton, Salome Arnt. 01/04/2013

## 2013-01-04 NOTE — Progress Notes (Signed)
Patient ID: Randy Neal, male   DOB: 04/13/1975, 37 y.o.   MRN: [id number] Morning wellness group- 09:00  The focus of this group is to educate the patient on the purpose and policies of crisis stabilization and provide a format to answer questions about their admission.  The group details unit policies and expectations of patients while admitted.  Patient did not attend group.

## 2013-01-05 DIAGNOSIS — F39 Unspecified mood [affective] disorder: Secondary | ICD-10-CM

## 2013-01-05 DIAGNOSIS — F321 Major depressive disorder, single episode, moderate: Secondary | ICD-10-CM

## 2013-01-05 MED ORDER — VILAZODONE HCL 20 MG PO TABS
20.0000 mg | ORAL_TABLET | Freq: Every day | ORAL | Status: DC
  Administered 2013-01-05 – 2013-01-07 (×3): 20 mg via ORAL
  Filled 2013-01-05: qty 14
  Filled 2013-01-05 (×4): qty 1
  Filled 2013-01-05: qty 14

## 2013-01-05 MED ORDER — LAMOTRIGINE 25 MG PO TABS
25.0000 mg | ORAL_TABLET | Freq: Every day | ORAL | Status: DC
  Administered 2013-01-05 – 2013-01-07 (×3): 25 mg via ORAL
  Filled 2013-01-05 (×2): qty 14
  Filled 2013-01-05 (×4): qty 1

## 2013-01-05 MED ORDER — CLINDAMYCIN HCL 300 MG PO CAPS
300.0000 mg | ORAL_CAPSULE | Freq: Three times a day (TID) | ORAL | Status: DC
  Administered 2013-01-05 – 2013-01-07 (×6): 300 mg via ORAL
  Filled 2013-01-05: qty 21
  Filled 2013-01-05: qty 1
  Filled 2013-01-05: qty 21
  Filled 2013-01-05 (×2): qty 1
  Filled 2013-01-05: qty 21
  Filled 2013-01-05 (×3): qty 1
  Filled 2013-01-05 (×2): qty 21
  Filled 2013-01-05: qty 1
  Filled 2013-01-05: qty 21
  Filled 2013-01-05 (×2): qty 1

## 2013-01-05 NOTE — [facility] Group Notes (Signed)
Adult Psychoeducational Group Note  Date:  01/05/2013 Time:  9:51 PM  Group Topic/Focus:  AA Meeting  Participation Level:  Did Not Attend  Participation Quality:  None  Affect:  None  Cognitive:  None  Insight: None  Engagement in Group:  None  Modes of Intervention:  Discussion and Education  Additional Comments:  [place] did not attend group.  Caroll Rancher A 01/05/2013, 9:51 PM

## 2013-01-05 NOTE — Progress Notes (Signed)
[facility] Group Notes:  (Nursing/MHT/Case Management/Adjunct)  Date:  01/05/2013  Time:  8:00 p.m.   Type of Therapy:  Psychoeducational Skills  Participation Level:  Active  Participation Quality:  Appropriate  Affect:  Appropriate  Cognitive:  Appropriate  Insight:  Good  Engagement in Group:  Engaged  Modes of Intervention:  Education  Summary of Progress/Problems: The patient attended the wrap-up group on the 500 hallway this evening. Patient states that he had a good day for a number of reasons. To begin with, he indicated that he enjoyed the groups. Secondly, he verbalized that he managed to take a "good nap" this afternoon. Finally, he stated that he is working on taking/ staying on his medication. As a theme for the day, he intends to remain on his medication as he states that not taking his medication is not an option for him.   Hazle Coca S 01/05/2013, 11:41 PM

## 2013-01-05 NOTE — Progress Notes (Signed)
Patient did attend the evening karaoke group. Pt participated by singing and dancing.

## 2013-01-05 NOTE — Tx Team (Addendum)
Interdisciplinary Treatment Plan Update (Adult)  Date: 01/05/2013   Time Reviewed: 11:29 AM  Progress in Treatment:  Attending groups: No.  Participating in groups:  No.  Taking medication as prescribed: Yes  Tolerating medication: Yes  Family/Significant othe contact made: Yes. SPE completed with pt's girlfriend.  Patient understands diagnosis: Yes, AEB seeking treatment for depression, SI with plan, and substance abuse/ETOH detox.  Discussing patient identified problems/goals with staff: Yes  Medical problems stabilized or resolved: Yes  Denies suicidal/homicidal ideation: Yes self report.  Patient has not harmed self or Others: Yes  New problem(s) identified: n/a Discharge Plan or Barriers: Pt has appt scheduled for 12/19 at the [facility] for med management and assessment for therapy services.  Additional comments: Randy Neal is a 37 y.o. Single caucasian male admitted voluntarily and emergently from [facility] for depression and suicidal ideation with plan of using gun to shoot himself. Reportedly he was referred by his psychiatrist--Dr. Ezzard Flax [facility]). Patient is accompanied by his spouse/girl friend of 10 years. He states that he's been "battling depression all my life". He has been feeling SI x 1 month and has 1 past SI attempt hx by overdose. He repots triggers: lost job 3 wks ago, financial problems and relational issues at home. He has only 1 past inpt admission with [facility], 20 yrs ago.  He admits using alcohol since his teens and drinks 2-3x's a week, 1-5 beers and last drank 4-5 days ago. He uses 1 bowl of THC occasionally, last use was 12/30/12. Pt also used cocaine recently at a wedding reception. He endorses depression: crying spells, racing thoughts, insomnia(5 hrs, daily) and isolates self. Pt says when his family asks if he feels like hurting himself, he responds--"not today', but tells this writer--I can't say I'll be around on Friday". His UDS is  positive for cocaine and marijuana. Reason for Continuation of Hospitalization: Mood stabilization Medication management  Estimated length of stay: 2 days (pt to d/c SUN by 10am per Dr. Dub Mikes) For review of initial/current patient goals, please see plan of care.  Attendees:  Patient:    Family:    Physician: Geoffery Lyons MD 01/05/2013 11:29 AM   Nursing: Alease Frame RN  01/05/2013 11:29 AM   Clinical Social Worker Jule Schlabach Smart, LCSWA  01/05/2013 11:29 AM  Other: Quintella Reichert, RN  01/05/2013 11:29 AM  Other:    Other: Massie Kluver, Community Care Coordinator  01/05/2013 11:29 AM   Other:    Scribe for Treatment Team:  [employer] LCSWA 01/05/2013 11:29 AM

## 2013-01-05 NOTE — Progress Notes (Signed)
D: Pt denies SI/HI/AV. Pt is pleasant and cooperative. Pt a little agitated about being on 300 hall, "I'm not here for substance"  A: Pt was offered support and encouragement. Pt was given scheduled medications. Pt was encourage to attend groups. Q 15 minute checks were done for safety.   R:Pt attends groups and interacts well with peers and staff. Pt is taking medication. Pt has no complaints at this time.Pt receptive to treatment and safety maintained on unit.

## 2013-01-05 NOTE — Progress Notes (Signed)
D: Patient denies SI/HI and A/V hallucinations; patient reports sleep is well; reports appetite is good; reports energy level is low ; reports ability to pay attention is improving; rates depression as 5/10; rates hopelessness 6/10; rates anxiety as 5/10; patient reports feeling brighter today and has complaints of headache  A: Monitored q 15 minutes; patient encouraged to attend groups; patient educated about medications; patient given medications per physician orders; patient encouraged to express feelings and/or concerns  R: Patient is cooperative and appropriate to circumstances;    patient's interaction with staff and peers is appropriate; patient was able to set goal to talk with staff 1:1 when having feelings of SI; patient is taking medications as prescribed and tolerating medications; patient is attending all groups

## 2013-01-05 NOTE — Progress Notes (Signed)
D: Pt denies SI/HI/AVH. Pt is pleasant and cooperative. Pt states L-elbow still hurting from cellulitis, but he's glad he started back on his antibiotics. " biggest thing is I need to get used to taking meds, I feel good when I take my meds"  A: Pt was offered support and encouragement. Pt was given scheduled medications. Pt was encourage to attend groups. Q 15 minute checks were done for safety.   R:Pt attends groups and interacts well with peers and staff. Pt is taking medication. Pt has no complaints.Pt receptive to treatment and safety maintained on unit.

## 2013-01-05 NOTE — [facility] Group Notes (Signed)
[facility] LCSW Aftercare Discharge Planning Group Note   01/05/2013 9:47 AM  Participation Quality:  DID NOT ATTEND   Smart, Randy Neal   
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
## 2013-01-05 NOTE — Progress Notes (Signed)
[facility] Adult Case Management Discharge Plan :  Will you be returning to the same living situation after discharge: Yes,  home At discharge, do you have transportation home?:Yes,  girlfriend Do you have the ability to pay for your medications: Yes, mental health  Release signed and turned in to medical records by CSW.  Patient to Follow up at: Follow-up Information   Follow up with [facility] On 02/02/2013. (Appointment scheduled at 10:30 am with Dr. Mila Homer for medication management on this date.  She will then schedule you for therapy.  )    Contact information:   [address] [place], [place] [address] Phone: [phone] Fax: [phone]      Patient denies SI/HI:   Yes,  during group/self report.    Safety Planning and Suicide Prevention discussed:  Yes,  SPE completed with pt's girlfriend. SPI pamphlet provided to pt and he was encouraged to share information with his support network.   Smart, Jess Toney LCSWA  01/05/2013, 3:44 PM

## 2013-01-06 NOTE — Progress Notes (Signed)
Adult Psychoeducational Group Note  Date: 01/06/2013  Time: 4:09 PM  Group Topic/Focus:  Making Healthy Choices: The focus of this group is to help patients identify negative/unhealthy choices they were using prior to admission and identify positive/healthier coping strategies to replace them upon discharge.  Participation Level: Did Not Attend  Participation Quality:  Affect:  Cognitive:  Insight:  Engagement in Group:  Modes of Intervention:  Additional Comments: Pt did not attend.  Isla Pence M  01/06/2013, 4:09 PM

## 2013-01-06 NOTE — [facility] Group Notes (Signed)
[facility] Group Notes:  (Nursing/MHT/Case Management/Adjunct)  Date:  01/06/2013  Time:  1:48 PM  Type of Therapy:  Psychoeducational Skills  Participation Level:  Did Not Attend   Randy Neal 01/06/2013, 1:48 PM

## 2013-01-06 NOTE — Progress Notes (Signed)
Patient ID: Judd Mccubbin, male   DOB: 1975-05-14, 37 y.o.   MRN: [id number] [facility] MD Progress Note  01/06/2013 1:16 PM Jaivyn Gulla  MRN:  [id number] Subjective:  Leander is up and active in the unit milieu. He is doing well and is anticipating being discharged on Sunday. He says he is sleeping well, his appetite is good and he continues to be conscious of his weight. He does go on to state that he ended up at [facility] because he was not taking his medications and that led to multiple problems. He now states that he is feeling much better, has minimal discomfort from his elbow and is otherwise doing quite well. No new complaints.   Diagnosis:   DSM5: Schizophrenia Disorders:  denies Obsessive-Compulsive Disorders:  denies Trauma-Stressor Disorders:  denies Substance/Addictive Disorders:  Cannabis Use Disorder - Moderate 9304.30), Cocaine Abuse Depressive Disorders:  Major Depressive Disorder - Moderate (296.22)  Axis I: Mood Disorder NOS  ADL's:  Intact  Sleep: Fair  Appetite:  Fair  Suicidal Ideation:  Plan:  denies Intent:  denies Means:  denies Homicidal Ideation:  Plan:  denies Intent:  denies Means:  denies AEB (as evidenced by):  Psychiatric Specialty Exam: Review of Systems  Constitutional: Negative.   HENT: Negative.   Eyes: Negative.   Respiratory: Negative.   Cardiovascular: Negative.   Gastrointestinal: Negative.   Genitourinary: Negative.   Musculoskeletal:       Elbow pain  Skin: Negative.   Neurological: Negative.   Endo/Heme/Allergies: Negative.   Psychiatric/Behavioral: Positive for depression and substance abuse. The patient is nervous/anxious.     Blood pressure 114/78, pulse 73, temperature 97.7 F (36.5 C), temperature source Oral, resp. rate 18, height 5\' 8"  (1.727 m), weight 87.544 kg (193 lb).Body mass index is 29.35 kg/(m^2).  General Appearance: Fairly Groomed  Patent attorney::  Fair  Speech:  normal  Volume:  normal  Mood:   Anxious, Depressed and worried  Affect:  Restricted  Thought Process:  Coherent and Goal Directed  Orientation:  Full (Time, Place, and Person)  Thought Content:  symptoms, worries, concerns  Suicidal Thoughts:  No  Homicidal Thoughts:  No  Memory:  Immediate;   Fair Recent;   Fair Remote;   Fair  Judgement:  Fair  Insight:  Present  Psychomotor Activity:  Restlessness  Concentration:  Fair  Recall:  Fair  Akathisia:  No  Handed:    AIMS (if indicated):     Assets:  Desire for Improvement Housing Social Support  Sleep:  Number of Hours: 5.75   Current Medications: Current Facility-Administered Medications  Medication Dose Route Frequency Provider Last Rate Last Dose  . acetaminophen (TYLENOL) tablet 650 mg  650 mg Oral Q6H PRN Kerry Hough, PA-C   650 mg at 01/06/13 7829  . alum & mag hydroxide-simeth (MAALOX/MYLANTA) 200-200-20 MG/5ML suspension 30 mL  30 mL Oral Q4H PRN Kerry Hough, PA-C      . buPROPion (WELLBUTRIN XL) 24 hr tablet 150 mg  150 mg Oral Daily Nehemiah Settle, MD   150 mg at 01/06/13 0727  . clindamycin (CLEOCIN) capsule 300 mg  300 mg Oral TID Rachael Fee, MD   300 mg at 01/06/13 1259  . hydrOXYzine (ATARAX/VISTARIL) tablet 50 mg  50 mg Oral QHS Nehemiah Settle, MD   50 mg at 01/05/13 2129  . lamoTRIgine (LAMICTAL) tablet 25 mg  25 mg Oral Daily Rachael Fee, MD   25 mg at 01/06/13  0981  . LORazepam (ATIVAN) tablet 1 mg  1 mg Oral Q6H PRN Rachael Fee, MD   1 mg at 01/05/13 2300  . magnesium hydroxide (MILK OF MAGNESIA) suspension 30 mL  30 mL Oral Daily PRN Kerry Hough, PA-C      . traZODone (DESYREL) tablet 50 mg  50 mg Oral QHS,MR X 1 Kerry Hough, PA-C   50 mg at 01/05/13 2129  . Vilazodone HCl TABS 20 mg  20 mg Oral Daily Rachael Fee, MD   20 mg at 01/06/13 1914    Lab Results:  No results found for this or any previous visit (from the past 48 hour(s)).  Physical Findings: AIMS: Facial and Oral Movements Muscles  of Facial Expression: None, normal Lips and Perioral Area: None, normal Jaw: None, normal Tongue: None, normal,Extremity Movements Upper (arms, wrists, hands, fingers): None, normal Lower (legs, knees, ankles, toes): None, normal, Trunk Movements Neck, shoulders, hips: None, normal, Overall Severity Severity of abnormal movements (highest score from questions above): None, normal Incapacitation due to abnormal movements: None, normal Patient's awareness of abnormal movements (rate only patient's report): No Awareness, Dental Status Current problems with teeth and/or dentures?: No Does patient usually wear dentures?: No  CIWA:  CIWA-Ar Total: 0 COWS:     Treatment Plan Summary: Daily contact with patient to assess and evaluate symptoms and progress in treatment Medication management  Plan:  1. Continue crisis management and stabilization. 2. Medication management to reduce current symptoms to base line and improve patient's overall level of functioning 3. Treat health problems as indicated. 4. Develop treatment plan to decrease risk of relapse upon discharge and the need for     readmission. 5. Psycho-social education regarding relapse prevention and self care. 6. Health care follow up as needed for medical problems. 7. Continue home medications where appropriate. 8. Will plan on discharge out tomorrow if no complications. Medical Decision Making Problem Points:  Review of psycho-social stressors (1) Data Points:  Review of medication regiment & side effects (2) Review of new medications or change in dosage (2)  I certify that inpatient services furnished can reasonably be expected to improve the patient's condition.  Rona Ravens. Mashburn RPAC 1:20 PM 01/06/2013 I agreed with findings and treatment plan of this patient

## 2013-01-06 NOTE — Progress Notes (Signed)
Patient ID: Randy Neal, male   DOB: 28-May-1975, 37 y.o.   MRN: [id number]  D: Pt has been appropriate on the unit today, he has attended all groups and engaged in treatment. 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 Pt took all medications without any problems, and reported that his medication regimen was working. Pt reported that she was negative SI/HI, and no AH/VH. A: 15 min checks continued for patient safety. R: Pts safety maintained.

## 2013-01-06 NOTE — [facility] Group Notes (Deleted)
[facility] Group Notes: (Clinical Social Work)   01/06/2013      Type of Therapy:  Group Therapy   Participation Level:  Did Not Attend    Ambrose Mantle, LCSW 01/06/2013, 12:17 PM

## 2013-01-06 NOTE — Progress Notes (Signed)
D.  Pt pleasant on approach, denies complaints at this time.  Feels ready for discharge tomorrow.  Denies SI/HI/hallucinations at this time.  Interacting appropriately within milieu.  Positive for evening wrap up group on 500 hall where he programs.  A.  Support and encouragement offered  R.  Pt remains safe on unit, will continue to monitor.

## 2013-01-07 DIAGNOSIS — F1420 Cocaine dependence, uncomplicated: Principal | ICD-10-CM

## 2013-01-07 DIAGNOSIS — F319 Bipolar disorder, unspecified: Secondary | ICD-10-CM

## 2013-01-07 MED ORDER — LAMOTRIGINE 25 MG PO TABS: 25.0000 mg | ORAL_TABLET | Freq: Every day | ORAL | Status: DC

## 2013-01-07 MED ORDER — CLINDAMYCIN HCL 300 MG PO CAPS: 300.0000 mg | ORAL_CAPSULE | Freq: Three times a day (TID) | ORAL | Status: DC

## 2013-01-07 MED ORDER — HYDROXYZINE HCL 50 MG PO TABS: 50.0000 mg | ORAL_TABLET | Freq: Every day | ORAL | Status: DC

## 2013-01-07 MED ORDER — TRAZODONE HCL 50 MG PO TABS: 50.0000 mg | ORAL_TABLET | Freq: Every evening | ORAL | Status: DC | PRN

## 2013-01-07 MED ORDER — LORAZEPAM 1 MG PO TABS
1.0000 mg | ORAL_TABLET | Freq: Two times a day (BID) | ORAL | Status: DC | PRN
Start: 2013-01-07 — End: 2013-10-10

## 2013-01-07 MED ORDER — VILAZODONE HCL 20 MG PO TABS: 20.0000 mg | ORAL_TABLET | Freq: Every day | ORAL | Status: DC

## 2013-01-07 MED ORDER — BUPROPION HCL ER (XL) 150 MG PO TB24: 150.0000 mg | ORAL_TABLET | Freq: Every day | ORAL | Status: DC

## 2013-01-07 NOTE — [facility] Group Notes (Signed)
[facility] Group Notes:  (Clinical Social Work)  01/07/2013  10:00-11:00AM  Summary of Progress/Problems:   The main focus of today's process group was to   identify the patient's current support system and decide on other supports that can be put in place.  The picture on workbook was used to discuss why additional supports are needed, and a hand-out was distributed with four definitions/levels of support, then used to talk about how patients have given and received all different kinds of support.  An emphasis was placed on using counselor, doctor, therapy groups, 12-step groups, and problem-specific support groups to expand supports.  The patient identified one additional support as being his therapist, in addition to his wife.  He was discharged during group.  Type of Therapy:  Process Group with Motivational Interviewing  Participation Level:  Active  Participation Quality:  Attentive, Sharing and Supportive  Affect:  Appropriate  Cognitive:  Appropriate and Oriented  Insight:  Engaged  Engagement in Therapy:  Engaged  Modes of Intervention:   Education, Support and Processing, Activity  [employer], LCSW 01/07/2013, 12:15pm

## 2013-01-07 NOTE — [facility] Group Notes (Signed)
[facility] Group Notes:  (Nursing/MHT/Case Management/Adjunct)  Date:  01/07/2013  Time:  1315  Type of Therapy:  Nurse Education  Participation Level:  Did Not Attend  Participation Quality:    Affect:    Cognitive:    Insight:    Engagement in Group:    Modes of Intervention:    Summary of Progress/Problems:  Cresenciano Lick 01/07/2013, 2:12 PM

## 2013-01-07 NOTE — [facility] Suicide Risk Assessment (Signed)
Suicide Risk Assessment  Discharge Assessment     Demographic Factors:  Male  Mental Status Per Nursing Assessment::   On Admission:  Self-harm thoughts  Current Mental Status by Physician: Alert oriented. Cooperative and improved insight. No suicidal or homicidal toughts. No psychosis or hallucinations.  Loss Factors: Decrease in vocational status and Financial problems/change in socioeconomic status  Historical Factors: Impulsivity  Risk Reduction Factors:   Sense of responsibility to family, Living with another person, especially a relative, Positive social support and Positive therapeutic relationship  Continued Clinical Symptoms:  Dysthymia More than one psychiatric diagnosis  Cognitive Features That Contribute To Risk:  Closed-mindedness Polarized thinking    Suicide Risk:  Minimal: No identifiable suicidal ideation.  Patients presenting with no risk factors but with morbid ruminations; may be classified as minimal risk based on the severity of the depressive symptoms  Discharge Diagnoses:   AXIS I:  Depressive Disorder NOS, Major Depression, Recurrent severe and Substance Induced Mood Disorder AXIS II:  Deferred AXIS III:   Past Medical History  Diagnosis Date  . Kidney stone   . Depression    AXIS IV:  economic problems and other psychosocial or environmental problems AXIS V:  51-60 moderate symptoms  Plan Of Care/Follow-up recommendations:  Activity:  as tolerated Diet:  regular Follow up with scheduled appointments. See discharge summary for details. Continue lamictal. Wellbutrin and Vilazodone. Is patient on multiple antipsychotic therapies at discharge:  No   Has Patient had three or more failed trials of antipsychotic monotherapy by history:  No  Recommended Plan for Multiple Antipsychotic Therapies: NA  Randy Neal 01/07/2013, 10:58 AM

## 2013-01-07 NOTE — [facility] Group Notes (Signed)
[facility] Group Notes:  (Clinical Social Work)  01/07/2013     10-11AM  Summary of Progress/Problems:   The main focus of today's process group was for the patient to identify ways in which they have in the past sabotaged their own recovery. Motivational Interviewing was utilized to ask the group members what they get out of their substance use, and what reasons they may have for wanting to change.  The Stages of Change were explained using a handout, and patients identified where they currently are with regard to stages of change.  The patient expressed that he is in the Preparation Stage of Change.  Type of Therapy:  Group Therapy - Process   Participation Level:  Minimal  Participation Quality:  Attentive, Sharing and Supportive  Affect:  Appropriate  Cognitive:  Appropriate and Oriented  Insight:  Engaged  Engagement in Therapy:  Engaged  Modes of Intervention:  Education, Support and Processing, Motivational Interviewing  Ambrose Mantle, LCSW 01/07/2013, 12:51 PM

## 2013-01-07 NOTE — Progress Notes (Signed)
Pt discharged at this time to wife. Pt denies SI/HI. Pt provided with supply of medications and prescriptions. Pt given discharge instructions and pt verbalized understanding, all belongings were returned.

## 2013-01-07 NOTE — Progress Notes (Signed)
D:  Patient's self inventory sheet, patient sleeps well, good appetite, high energy level, good attention span.  Rated depression and hopeless #3.  Denied withdrawals.  Denied SI.  Denied physical problems.  Worst pain in past 24 hours #3.  After discharge, plans to take his medications.  Does have discharge plans.  No problems taking meds after discharge. A:  Medications administered per MD orders.  Emotional support and encouragement given patient. R:  Denied SI and HI.  Denied A/V hallucinations.  Denied pain.  Will continue to monitor patient for safety with 15 minute checks.  Safety maintained.

## 2013-01-07 NOTE — Discharge Summary (Signed)
Physician Discharge Summary Note  Patient:  Randy Neal is an 37 y.o., male MRN:  [id number] DOB:  [birth year] Patient phone:  [phone] (home)  Patient address:   [address] [place] [address],   Date of Admission:  01/04/2013 Date of Discharge: 01/07/13  Reason for Admission: Drug detox  Discharge Diagnoses: Principal Problem:   Bipolar disorder, unspecified Active Problems:   Substance induced mood disorder   Cocaine abuse with intoxication and without complication   Cannabis dependence with intoxication  Review of Systems  Constitutional: Negative.   HENT: Negative.   Eyes: Negative.   Respiratory: Negative.   Cardiovascular: Negative.   Gastrointestinal: Negative.   Genitourinary: Negative.   Musculoskeletal: Negative.   Skin: Negative.   Neurological: Negative.   Endo/Heme/Allergies: Negative.   Psychiatric/Behavioral: Positive for depression (Stabilized with medication prior to discharge), hallucinations and substance abuse (Hx Cocaine/Cannabis dependence). Negative for suicidal ideas and memory loss. The patient is nervous/anxious (Stabilized with medication prior to discharge) and has insomnia (Stabilized with medication prior to discharge).     DSM5: Schizophrenia Disorders:  NA Obsessive-Compulsive Disorders:  NA Trauma-Stressor Disorders:  NA Substance/Addictive Disorders:  Cannabis Use Disorder - Severe (304.30), Cocaine dependence Depressive Disorders:  Substance induced mood disorder  Axis Diagnosis:   AXIS I:  Substance Induced Mood Disorder and Cannabis Use Disorder - Severe (304.30), Cocaine dependence AXIS II:  Deferred AXIS III:   Past Medical History  Diagnosis Date  . Kidney stone   . Depression    AXIS IV:  other psychosocial or environmental problems and Substance abuse/dependence AXIS V:  63  Level of Care:  OP  Hospital Course:  Randy Neal is a 37 y.o. Single caucasian male admitted voluntarily and  emergently from [facility] for depression and suicidal ideation with plan of using gun to shoot himself. Reportedly he was referred by his psychiatrist--Dr. Ezzard [facility]). Patient is accompanied by his spouse/girl friend of 10 years. He states that he's been "battling depression all my life". He has been feeling SI x 1 month and has 1 past SI attempt hx by overdose. He repots triggers: lost job 3 wks ago, financial problems and relational issues at home. He has only 1 past inpt admission with Randy Neal, 20 yrs ago. He admits using alcohol since his teens and drinks 2-3x's a week, 1-5 beers and last drank 4-5 days ago. He uses 1 bowl of THC occasionally, last use was 12/30/12. Pt also used cocaine recently at a wedding reception. He endorses depression: crying spells, racing thoughts, insomnia(5 hrs, daily) and isolates self. Pt says when his family asks if he feels like hurting himself, he responds--"not today', but tells this writer--I can't say I'll be around on Friday". His UDS is positive for cocaine and marijuana.  While a patient in this hospital and after admission assessment/evaluation, it was determined based on patient's symptoms that his toxicology/UDS reports indicated no presence of alcohol, rather (+) cocaine and THC in his systems. Although feeling depressed, patient was not presenting any withdrawal symptoms of alcohol and or any other substances. As a result, he received no detoxification treatment protocols. However, it was determined that he will need medication management to re-stabilize his current depressive mood symptoms as he has a history of mood disorder issues. He was then ordered and received Lamictal 25 mg daily for mood control, Wellbutrin XL 150 mg daily for depression, Hydroxyzine 25 mg Q bedtime for anxiety,Trazodone 50 mg Q bedtime for sleep, Lorazepam 1  mg bid prn for anxiety and Vilazodone 20 mg daily for depression. He also was enrolled in group counseling sessions  and activities where he was counseled and learned coping skills that should help him cope better and manage his symptoms effectively after discharge. Randy Neal also received medication management and monitoring for his other previously existing medical issues and concerns. He tolerated his treatment regimen without any significant adverse effects and or reactions presented.   Patient did respond positively to his treatment regimen. This is evidenced by his daily reports of improved mood, reduction of symptoms and presentation of good affect/eye contact. He attended treatment team meeting this am and met with his treatment team members. His reason for admission, present symptoms, response to treatment and discharge plans discussed with patient. Randy Neal endorsed that his symptoms has stabilized and that he is ready for discharge to pursue psychiatric care/drug rehabilitation on an outpatient basis at [facility] treatment center here in [place], [place] starting on 02/02/13 at 10:30 am. The address, date, time and contact information for this treatment center provided for patient in writing.  Upon discharge, Randy Neal adamantly denies any suicidal, homicidal ideations, auditory, visual hallucinations, paranoia and or delusional thoughts. He was provided with 14 days worth supply samples of his [facility] discharge medications. He left [facility] with all personal belongings in no apparent distress. Transportation per patient arrangement.  Consults:  psychiatry  Significant Diagnostic Studies:  labs: CBC with diff, CMP, UDS, Toxicology tests, U/A  Discharge Vitals:   Blood pressure 106/70, pulse 80, temperature 97.7 F (36.5 C), temperature source Oral, resp. rate 18, height 5\' 8"  (1.727 m), weight 87.544 kg (193 lb). Body mass index is 29.35 kg/(m^2). Lab Results:   No results found for this or any previous visit (from the past 72 hour(s)).  Physical Findings: AIMS: Facial and Oral Movements Muscles of  Facial Expression: None, normal Lips and Perioral Area: None, normal Jaw: None, normal Tongue: None, normal,Extremity Movements Upper (arms, wrists, hands, fingers): None, normal Lower (legs, knees, ankles, toes): None, normal, Trunk Movements Neck, shoulders, hips: None, normal, Overall Severity Severity of abnormal movements (highest score from questions above): None, normal Incapacitation due to abnormal movements: None, normal Patient's awareness of abnormal movements (rate only patient's report): No Awareness, Dental Status Current problems with teeth and/or dentures?: No Does patient usually wear dentures?: No  CIWA:  CIWA-Ar Total: 0 COWS:     Psychiatric Specialty Exam: See Psychiatric Specialty Exam and Suicide Risk Assessment completed by Attending Physician prior to discharge.  Discharge destination:  Home  Is patient on multiple antipsychotic therapies at discharge:  No   Has Patient had three or more failed trials of antipsychotic monotherapy by history:  No  Recommended Plan for Multiple Antipsychotic Therapies: NA       Future Appointments Provider Department Dept Phone   01/15/2013 2:00 PM [facility] Covering Provider [facility] [phone]       Medication List    STOP taking these medications       ARIPiprazole 2 MG tablet  Commonly known as:  ABILIFY     diphenhydrAMINE 25 mg capsule  Commonly known as:  BENADRYL     omeprazole 10 MG capsule  Commonly known as:  PRILOSEC     oxyCODONE-acetaminophen 5-325 MG per tablet  Commonly known as:  PERCOCET/ROXICET      TAKE these medications     Indication   buPROPion 150 MG 24 hr tablet  Commonly known as:  WELLBUTRIN XL  Take 1 tablet (150 mg total) by mouth daily. For depression   Indication:  Depressive Phase of Manic-Depression     clindamycin 300 MG capsule  Commonly known as:  CLEOCIN  Take 1 capsule (300 mg total) by mouth 3 (three) times daily. For infection    Indication:  Infection     hydrOXYzine 50 MG tablet  Commonly known as:  ATARAX/VISTARIL  Take 1 tablet (50 mg total) by mouth at bedtime. For anxiety management   Indication:  Anxiety associated with Organic Disease     lamoTRIgine 25 MG tablet  Commonly known as:  LAMICTAL  Take 1 tablet (25 mg total) by mouth daily. For mood stabilization   Indication:  Mood stabilization     LORazepam 1 MG tablet  Commonly known as:  ATIVAN  Take 1 tablet (1 mg total) by mouth 2 (two) times daily as needed for anxiety.   Indication:  Feeling Anxious, Anxiousness associated with Depression, Panic Disorder     traZODone 50 MG tablet  Commonly known as:  DESYREL  Take 1 tablet (50 mg total) by mouth at bedtime and may repeat dose one time if needed. For sleep   Indication:  Trouble Sleeping     Vilazodone HCl 20 MG Tabs  Commonly known as:  VIIBRYD  Take 1 tablet (20 mg total) by mouth daily. For depression   Indication:  Major Depressive Disorder       Follow-up Information   Follow up with [facility] On 02/02/2013. (Appointment scheduled at 10:30 am with Dr. Mila Homer for medication management on this date.  She will then schedule you for therapy.  )    Contact information:   [address] [place], [place] [address] Phone: [phone] Fax: [phone]     Follow-up recommendations: Activity:  As tolerated Diet: As recommended by your primary care doctor. Keep all scheduled follow-up appointments as recommended.   Comments: Take all your medications as prescribed by your mental healthcare provider. Report any adverse effects and or reactions from your medicines to your outpatient provider promptly. Patient is instructed and cautioned to not engage in alcohol and or illegal drug use while on prescription medicines. In the event of worsening symptoms, patient is instructed to call the crisis hotline, 911 and or go to the nearest ED for appropriate evaluation and treatment of  symptoms. Follow-up with your primary care provider for your other medical issues, concerns and or health care needs.   Total Discharge Time:  Greater than 30 minutes.  Signed: Sanjuana Kava, PMHNP-BC, FNP 01/07/2013, 9:54 AM

## 2013-01-09 NOTE — Progress Notes (Signed)
Patient Discharge Instructions:  After Visit Summary (AVS):   Faxed to:  01/09/13 Discharge Summary Note:   Faxed to:  01/09/13 Psychiatric Admission Assessment Note:   Faxed to:  01/09/13 Suicide Risk Assessment - Discharge Assessment:   Faxed to:  01/09/13 Faxed/Sent to the Next Level Care provider:  01/09/13 Faxed to [facility] @ [phone]  Jerelene Redden, 01/09/2013, 4:11 PM

## 2013-01-15 ENCOUNTER — Inpatient Hospital Stay: Payer: Self-pay

## 2013-02-26 ENCOUNTER — Encounter (HOSPITAL_COMMUNITY): Payer: Self-pay | Admitting: Emergency Medicine

## 2013-02-26 ENCOUNTER — Emergency Department (HOSPITAL_COMMUNITY)
Admission: EM | Admit: 2013-02-26 | Discharge: 2013-02-26 | Disposition: A | Discharge status: Home / Self Care | Payer: Self-pay | Attending: Emergency Medicine | Admitting: Emergency Medicine

## 2013-02-26 ENCOUNTER — Emergency Department (HOSPITAL_COMMUNITY): Payer: Self-pay

## 2013-02-26 DIAGNOSIS — Z87442 Personal history of urinary calculi: Secondary | ICD-10-CM | POA: Insufficient documentation

## 2013-02-26 DIAGNOSIS — Z79899 Other long term (current) drug therapy: Secondary | ICD-10-CM | POA: Insufficient documentation

## 2013-02-26 DIAGNOSIS — F329 Major depressive disorder, single episode, unspecified: Secondary | ICD-10-CM | POA: Insufficient documentation

## 2013-02-26 DIAGNOSIS — M545 Low back pain, unspecified: Secondary | ICD-10-CM | POA: Insufficient documentation

## 2013-02-26 DIAGNOSIS — F411 Generalized anxiety disorder: Secondary | ICD-10-CM | POA: Insufficient documentation

## 2013-02-26 DIAGNOSIS — IMO0001 Reserved for inherently not codable concepts without codable children: Secondary | ICD-10-CM | POA: Insufficient documentation

## 2013-02-26 DIAGNOSIS — F17200 Nicotine dependence, unspecified, uncomplicated: Secondary | ICD-10-CM | POA: Insufficient documentation

## 2013-02-26 DIAGNOSIS — F3289 Other specified depressive episodes: Secondary | ICD-10-CM | POA: Insufficient documentation

## 2013-02-26 DIAGNOSIS — G8929 Other chronic pain: Secondary | ICD-10-CM | POA: Insufficient documentation

## 2013-02-26 DIAGNOSIS — J020 Streptococcal pharyngitis: Secondary | ICD-10-CM | POA: Insufficient documentation

## 2013-02-26 LAB — CBC WITH DIFFERENTIAL/PLATELET
BASOS ABS: 0 10*3/uL (ref 0.0–0.1)
BASOS PCT: 0 % (ref 0–1)
EOS ABS: 0 10*3/uL (ref 0.0–0.7)
Eosinophils Relative: 0 % (ref 0–5)
HCT: 38.8 % — ABNORMAL LOW (ref 39.0–52.0)
Hemoglobin: 13.4 g/dL (ref 13.0–17.0)
Lymphocytes Relative: 9 % — ABNORMAL LOW (ref 12–46)
Lymphs Abs: 1.2 10*3/uL (ref 0.7–4.0)
MCH: 30.7 pg (ref 26.0–34.0)
MCHC: 34.5 g/dL (ref 30.0–36.0)
MCV: 89 fL (ref 78.0–100.0)
Monocytes Absolute: 0.9 10*3/uL (ref 0.1–1.0)
Monocytes Relative: 7 % (ref 3–12)
NEUTROS ABS: 10.3 10*3/uL — AB (ref 1.7–7.7)
Neutrophils Relative %: 83 % — ABNORMAL HIGH (ref 43–77)
PLATELETS: 186 10*3/uL (ref 150–400)
RBC: 4.36 MIL/uL (ref 4.22–5.81)
RDW: 13.2 % (ref 11.5–15.5)
WBC: 12.4 10*3/uL — ABNORMAL HIGH (ref 4.0–10.5)

## 2013-02-26 LAB — URINALYSIS, ROUTINE W REFLEX MICROSCOPIC
BILIRUBIN URINE: NEGATIVE
Glucose, UA: NEGATIVE mg/dL
Hgb urine dipstick: NEGATIVE
Ketones, ur: NEGATIVE mg/dL
Leukocytes, UA: NEGATIVE
NITRITE: NEGATIVE
PH: 6 (ref 5.0–8.0)
Protein, ur: NEGATIVE mg/dL
Specific Gravity, Urine: 1.026 (ref 1.005–1.030)
UROBILINOGEN UA: 0.2 mg/dL (ref 0.0–1.0)

## 2013-02-26 LAB — COMPREHENSIVE METABOLIC PANEL
ALT: 20 U/L (ref 0–53)
AST: 13 U/L (ref 0–37)
Albumin: 3.5 g/dL (ref 3.5–5.2)
Alkaline Phosphatase: 72 U/L (ref 39–117)
BILIRUBIN TOTAL: 0.3 mg/dL (ref 0.3–1.2)
BUN: 11 mg/dL (ref 6–23)
CHLORIDE: 99 meq/L (ref 96–112)
CO2: 24 meq/L (ref 19–32)
Calcium: 8.8 mg/dL (ref 8.4–10.5)
Creatinine, Ser: 1 mg/dL (ref 0.50–1.35)
Glucose, Bld: 162 mg/dL — ABNORMAL HIGH (ref 70–99)
Potassium: 3.5 mEq/L — ABNORMAL LOW (ref 3.7–5.3)
SODIUM: 136 meq/L — AB (ref 137–147)
Total Protein: 6.6 g/dL (ref 6.0–8.3)

## 2013-02-26 LAB — RAPID STREP SCREEN (MED CTR MEBANE ONLY): Streptococcus, Group A Screen (Direct): POSITIVE — AB

## 2013-02-26 MED ORDER — ONDANSETRON 4 MG PO TBDP
4.0000 mg | ORAL_TABLET | Freq: Once | ORAL | Status: AC
  Administered 2013-02-26: 4 mg via ORAL
  Filled 2013-02-26: qty 1

## 2013-02-26 MED ORDER — CYCLOBENZAPRINE HCL 10 MG PO TABS: 10.0000 mg | ORAL_TABLET | Freq: Two times a day (BID) | ORAL | Status: DC | PRN

## 2013-02-26 MED ORDER — PENICILLIN G BENZATHINE 1200000 UNIT/2ML IM SUSP
1.2000 10*6.[IU] | Freq: Once | INTRAMUSCULAR | Status: AC
  Administered 2013-02-26: 1.2 10*6.[IU] via INTRAMUSCULAR
  Filled 2013-02-26: qty 2

## 2013-02-26 MED ORDER — DIPHENHYDRAMINE HCL 25 MG PO CAPS
25.0000 mg | ORAL_CAPSULE | Freq: Once | ORAL | Status: AC
Start: 2013-02-26 — End: 2013-02-26
  Administered 2013-02-26: 25 mg via ORAL
  Filled 2013-02-26: qty 1

## 2013-02-26 MED ORDER — PENICILLIN V POTASSIUM 500 MG PO TABS: 500.0000 mg | ORAL_TABLET | Freq: Three times a day (TID) | ORAL | Status: DC

## 2013-02-26 MED ORDER — OXYCODONE-ACETAMINOPHEN 5-325 MG PO TABS: 1.0000 | ORAL_TABLET | Freq: Four times a day (QID) | ORAL | Status: DC | PRN

## 2013-02-26 MED ORDER — MORPHINE SULFATE 4 MG/ML IJ SOLN
4.0000 mg | Freq: Once | INTRAMUSCULAR | Status: AC
  Administered 2013-02-26: 4 mg via INTRAMUSCULAR
  Filled 2013-02-26: qty 1

## 2013-02-26 MED ORDER — OXYCODONE-ACETAMINOPHEN 5-325 MG PO TABS
2.0000 | ORAL_TABLET | Freq: Once | ORAL | Status: AC
  Administered 2013-02-26: 2 via ORAL
  Filled 2013-02-26: qty 2

## 2013-02-26 NOTE — Discharge Instructions (Signed)
Strep Throat Strep throat is an infection of the throat caused by a bacteria named Streptococcus pyogenes. Your caregiver may call the infection streptococcal "tonsillitis" or "pharyngitis" depending on whether there are signs of inflammation in the tonsils or back of the throat. Strep throat is most common in children aged 39 15 years during the cold months of the year, but it can occur in people of any age during any season. This infection is spread from person to person (contagious) through coughing, sneezing, or other close contact. SYMPTOMS   Fever or chills.  Painful, swollen, red tonsils or throat.  Pain or difficulty when swallowing.  White or yellow spots on the tonsils or throat.  Swollen, tender lymph nodes or "glands" of the neck or under the jaw.  Red rash all over the body (rare). DIAGNOSIS  Many different infections can cause the same symptoms. A test must be done to confirm the diagnosis so the right treatment can be given. A "rapid strep test" can help your caregiver make the diagnosis in a few minutes. If this test is not available, a light swab of the infected area can be used for a throat culture test. If a throat culture test is done, results are usually available in a day or two. TREATMENT  Strep throat is treated with antibiotic medicine. HOME CARE INSTRUCTIONS   Gargle with 1 tsp of salt in 1 cup of warm water, 3 4 times per day or as needed for comfort.  Family members who also have a sore throat or fever should be tested for strep throat and treated with antibiotics if they have the strep infection.  Make sure everyone in your household washes their hands well.  Do not share food, drinking cups, or personal items that could cause the infection to spread to others.  You may need to eat a soft food diet until your sore throat gets better.  Drink enough water and fluids to keep your urine clear or pale yellow. This will help prevent dehydration.  Get plenty of  rest.  Stay home from school, daycare, or work until you have been on antibiotics for 24 hours.  Only take over-the-counter or prescription medicines for pain, discomfort, or fever as directed by your caregiver.  If antibiotics are prescribed, take them as directed. Finish them even if you start to feel better. SEEK MEDICAL CARE IF:   The glands in your neck continue to enlarge.  You develop a rash, cough, or earache.  You cough up green, yellow-brown, or bloody sputum.  You have pain or discomfort not controlled by medicines.  Your problems seem to be getting worse rather than better. SEEK IMMEDIATE MEDICAL CARE IF:   You develop any new symptoms such as vomiting, severe headache, stiff or painful neck, chest pain, shortness of breath, or trouble swallowing.  You develop severe throat pain, drooling, or changes in your voice.  You develop swelling of the neck, or the skin on the neck becomes red and tender.  You have a fever.  You develop signs of dehydration, such as fatigue, dry mouth, and decreased urination.  You become increasingly sleepy, or you cannot wake up completely. Document Released: 01/30/2000 Document Revised: 01/19/2012 Document Reviewed: 04/02/2010 [facility] Patient Information 2014 [place], [place].  Back Pain, Adult Low back pain is very common. About 1 in 5 people have back pain.The cause of low back pain is rarely dangerous. The pain often gets better over time.About half of people with a sudden onset of  back pain feel better in just 2 weeks. About 8 in 10 people feel better by 6 weeks.  CAUSES Some common causes of back pain include:  Strain of the muscles or ligaments supporting the spine.  Wear and tear (degeneration) of the spinal discs.  Arthritis.  Direct injury to the back. DIAGNOSIS Most of the time, the direct cause of low back pain is not known.However, back pain can be treated effectively even when the exact cause of the pain is  unknown.Answering your caregiver's questions about your overall health and symptoms is one of the most accurate ways to make sure the cause of your pain is not dangerous. If your caregiver needs more information, he or she may order lab work or imaging tests (X-rays or MRIs).However, even if imaging tests show changes in your back, this usually does not require surgery. HOME CARE INSTRUCTIONS For many people, back pain returns.Since low back pain is rarely dangerous, it is often a condition that people can learn to [facility] their own.   Remain active. It is stressful on the back to sit or stand in one place. Do not sit, drive, or stand in one place for more than 30 minutes at a time. Take short walks on level surfaces as soon as pain allows.Try to increase the length of time you walk each day.  Do not stay in bed.Resting more than 1 or 2 days can delay your recovery.  Do not avoid exercise or work.Your body is made to move.It is not dangerous to be active, even though your back may hurt.Your back will likely heal faster if you return to being active before your pain is gone.  Pay attention to your body when you bend and lift. Many people have less discomfortwhen lifting if they bend their knees, keep the load close to their bodies,and avoid twisting. Often, the most comfortable positions are those that put less stress on your recovering back.  Find a comfortable position to sleep. Use a firm mattress and lie on your side with your knees slightly bent. If you lie on your back, put a pillow under your knees.  Only take over-the-counter or prescription medicines as directed by your caregiver. Over-the-counter medicines to reduce pain and inflammation are often the most helpful.Your caregiver may prescribe muscle relaxant drugs.These medicines help dull your pain so you can more quickly return to your normal activities and healthy exercise.  Put ice on the injured area.  Put ice in a  plastic bag.  Place a towel between your skin and the bag.  Leave the ice on for 15-20 minutes, 03-04 times a day for the first 2 to 3 days. After that, ice and heat may be alternated to reduce pain and spasms.  Ask your caregiver about trying back exercises and gentle massage. This may be of some benefit.  Avoid feeling anxious or stressed.Stress increases muscle tension and can worsen back pain.It is important to recognize when you are anxious or stressed and learn ways to manage it.Exercise is a great option. SEEK MEDICAL CARE IF:  You have pain that is not relieved with rest or medicine.  You have pain that does not improve in 1 week.  You have new symptoms.  You are generally not feeling well. SEEK IMMEDIATE MEDICAL CARE IF:   You have pain that radiates from your back into your legs.  You develop new bowel or bladder control problems.  You have unusual weakness or numbness in your arms or legs.  You develop nausea or vomiting.  You develop abdominal pain.  You feel faint. Document Released: 02/01/2005 Document Revised: 08/03/2011 Document Reviewed: 06/22/2010 [facility] Patient Information 2014 [place], [place].

## 2013-02-26 NOTE — ED Provider Notes (Signed)
Medical screening examination/treatment/procedure(s) were performed by non-physician practitioner and as supervising physician I was immediately available for consultation/collaboration.    Nelia Shiobert L Okley Magnussen, MD 02/26/13 [phone]

## 2013-02-26 NOTE — ED Notes (Signed)
Patient transported to X-ray 

## 2013-02-26 NOTE — ED Provider Notes (Signed)
CSN: [id number]     Arrival date & time 02/26/13  1552 History   First MD Initiated Contact with Patient 02/26/13 1730     Chief Complaint  Patient presents with  . Back Pain  . Sore Throat  . Weakness   (Consider location/radiation/quality/duration/timing/severity/associated sxs/prior Treatment) HPI  Patient presents to the ER with complaints of two day history of sore throat, chills, hot flashes, myalgias and low back pain. He has chronic low back pain and describes the chills exacerbating the symptoms. He also says he broke out in a cold sweat as well. He had his wife look at his throat but she didn't see anything. His wife and two children are also sick and have been diagnosed with the floo. He reports normally being healthy guy aside from his depression and anxiety. He denies having having fevers, ear pain, neck pain, headache, abdominal pain, confusion n,v or diarrhea.  Past Medical History  Diagnosis Date  . Kidney stone   . Depression    Past Surgical History  Procedure Laterality Date  . Kidney stone surgery Right 04/20/1994   No family history on file. History  Substance Use Topics  . Smoking status: Heavy Tobacco Smoker -- 0.50 packs/day for 15 years    Types: Cigarettes  . Smokeless tobacco: Never Used  . Alcohol Use: Yes     Comment: everyday    Review of Systems The patient denies anorexia, fever, weight loss,, vision loss, decreased hearing, hoarseness, chest pain, syncope, dyspnea on exertion, peripheral edema, balance deficits, hemoptysis, abdominal pain, melena, hematochezia, severe indigestion/heartburn, hematuria, incontinence, genital sores, muscle weakness, suspicious skin lesions, transient blindness, difficulty walking, depression, unusual weight change, abnormal bleeding, enlarged lymph nodes, angioedema, and breast masses.  Allergies  Doxycycline  Home Medications   Current Outpatient Rx  Name  Route  Sig  Dispense  Refill  . buPROPion (WELLBUTRIN  XL) 150 MG 24 hr tablet   Oral   Take 1 tablet (150 mg total) by mouth daily. For depression   30 tablet   0   . hydrOXYzine (ATARAX/VISTARIL) 50 MG tablet   Oral   Take 1 tablet (50 mg total) by mouth at bedtime. For anxiety management   30 tablet   0   . ibuprofen (ADVIL,MOTRIN) 200 MG tablet   Oral   Take 600 mg by mouth every 6 (six) hours as needed for mild pain.         Marland Kitchen lamoTRIgine (LAMICTAL) 25 MG tablet   Oral   Take 1 tablet (25 mg total) by mouth daily. For mood stabilization   30 tablet   0   . LORazepam (ATIVAN) 1 MG tablet   Oral   Take 1 tablet (1 mg total) by mouth 2 (two) times daily as needed for anxiety.   60 tablet   0   . traZODone (DESYREL) 50 MG tablet   Oral   Take 1 tablet (50 mg total) by mouth at bedtime and may repeat dose one time if needed. For sleep   60 tablet   0   . Vilazodone HCl (VIIBRYD) 20 MG TABS   Oral   Take 1 tablet (20 mg total) by mouth daily. For depression   30 tablet   0    BP 130/74  Pulse 81  Temp(Src) 98.6 F (37 C)  Resp 20  SpO2 94% Physical Exam  Nursing note and vitals reviewed. Constitutional: He is oriented to person, place, and time. He appears well-developed and well-nourished.  No distress.  HENT:  Head: Normocephalic and atraumatic.  Right Ear: Tympanic membrane, external ear and ear canal normal.  Left Ear: Tympanic membrane, external ear and ear canal normal.  Nose: Nose normal. No rhinorrhea. Right sinus exhibits no maxillary sinus tenderness and no frontal sinus tenderness. Left sinus exhibits no maxillary sinus tenderness and no frontal sinus tenderness.  Mouth/Throat: Uvula is midline and mucous membranes are normal. No trismus in the jaw. Normal dentition. No dental abscesses or uvula swelling. Oropharyngeal exudate and posterior oropharyngeal edema present. No posterior oropharyngeal erythema or tonsillar abscesses.  No submental edema, tongue not elevated, no trismus. No impending airway  obstruction; Pt able to speak full sentences, swallow intact, no drooling, stridor, or tonsillar/uvula displacement. No palatal petechia  Eyes: Conjunctivae are normal.  Neck: Trachea normal, normal range of motion and full passive range of motion without pain. Neck supple. No rigidity. Normal range of motion present. No Brudzinski's sign noted.  Flexion and extension of neck without pain or difficulty. Able to breath without difficulty in extension.  Cardiovascular: Normal rate and regular rhythm.   Pulmonary/Chest: Effort normal and breath sounds normal. No stridor. No respiratory distress. He has no wheezes.  Abdominal: Soft. There is no tenderness.  No obvious evidence of splenomegaly. Non ttp.   Musculoskeletal: Normal range of motion.   Equal strength to bilateral lower extremities. Neurosensory function adequate to both legs. Skin color is normal. Skin is warm and moist. I see no step off deformity, no bony tenderness. Pt is able to ambulate without limp. Pain is relieved when sitting in certain positions. ROM is decreased due to pain. No crepitus, laceration, effusion, swelling.  Pulses are normal   Lymphadenopathy:       Head (right side): No preauricular and no posterior auricular adenopathy present.       Head (left side): No preauricular and no posterior auricular adenopathy present.    He has cervical adenopathy.  Neurological: He is alert and oriented to person, place, and time.  Skin: Skin is warm and dry. No rash noted. He is not diaphoretic.  Psychiatric: He has a normal mood and affect.    ED Course  Procedures (including critical care time) Labs Review Labs Reviewed  RAPID STREP SCREEN - Abnormal; Notable for the following:    Streptococcus, Group A Screen (Direct) POSITIVE (*)    All other components within normal limits  COMPREHENSIVE METABOLIC PANEL - Abnormal; Notable for the following:    Sodium 136 (*)    Potassium 3.5 (*)    Glucose, Bld 162 (*)    All other  components within normal limits  CBC WITH DIFFERENTIAL - Abnormal; Notable for the following:    WBC 12.4 (*)    HCT 38.8 (*)    Neutrophils Relative % 83 (*)    Neutro Abs 10.3 (*)    Lymphocytes Relative 9 (*)    All other components within normal limits  URINALYSIS, ROUTINE W REFLEX MICROSCOPIC   Imaging Review Dg Lumbar Spine Complete  02/26/2013   CLINICAL DATA:  Chronic low back pain which has acutely worsened over the past 2 days. Patient currently unable to stand or walk.  EXAM: LUMBAR SPINE - COMPLETE 4+ VIEW  COMPARISON:  None.  FINDINGS: Five non rib-bearing lumbar vertebrae with anatomic alignment. No fractures. Mild disc space narrowing and endplate hypertrophic changes at L3-4. Remaining disc spaces well preserved. No pars defects. No significant facet arthropathy. Visualized sacroiliac joints intact.  IMPRESSION: Mild degenerative disc  disease L3-4.  Otherwise normal examination.   Electronically Signed   By: Hulan Saas M.D.   On: 02/26/2013 18:19    EKG Interpretation   None       MDM   1. Strep throat    Strep screen positive Remaining labs and xrays are reassuring  Will give IM Penicillin and Morphine  Rx: penicillin, flexeril and Percocet  37 y.o.Randy Neal's evaluation in the Emergency Department is complete. It has been determined that no acute conditions requiring further emergency intervention are present at this time. The patient/guardian have been advised of the diagnosis and plan. We have discussed signs and symptoms that warrant return to the ED, such as changes or worsening in symptoms.  Vital signs are stable at discharge. Filed Vitals:   02/26/13 1607  BP: 130/74  Pulse: 81  Temp: 98.6 F (37 C)  Resp: 20    Patient/guardian has voiced understanding and agreed to follow-up with the PCP or specialist.     Dorthula Matas, PA-C 02/26/13 1948

## 2013-02-26 NOTE — ED Notes (Addendum)
Pt states he started feeling bad yesterday, with sore throat. States he woke up this morning and "felt like he'd been hit by a truck", now he feels weak and very fatigue. . Pt also c/o lower back and bilateral hip pain. Pt states he broke out in a sweat last night.Denies n/v/d.

## 2013-02-26 NOTE — ED Notes (Signed)
Pt verbalizes understanding 

## 2013-02-26 NOTE — ED Notes (Signed)
Bed: [id number] Expected date:  Expected time:  Means of arrival:  Comments: Hold for CenterPoint EnergyCecil Neal

## 2013-08-23 ENCOUNTER — Emergency Department (HOSPITAL_COMMUNITY)
Admission: EM | Admit: 2013-08-23 | Discharge: 2013-08-23 | Disposition: A | Discharge status: Home / Self Care | Payer: Self-pay | Attending: Emergency Medicine | Admitting: Emergency Medicine

## 2013-08-23 ENCOUNTER — Encounter (HOSPITAL_COMMUNITY): Payer: Self-pay | Admitting: Emergency Medicine

## 2013-08-23 DIAGNOSIS — L089 Local infection of the skin and subcutaneous tissue, unspecified: Secondary | ICD-10-CM

## 2013-08-23 DIAGNOSIS — L0889 Other specified local infections of the skin and subcutaneous tissue: Secondary | ICD-10-CM | POA: Insufficient documentation

## 2013-08-23 DIAGNOSIS — F17200 Nicotine dependence, unspecified, uncomplicated: Secondary | ICD-10-CM | POA: Insufficient documentation

## 2013-08-23 DIAGNOSIS — Z79899 Other long term (current) drug therapy: Secondary | ICD-10-CM | POA: Insufficient documentation

## 2013-08-23 DIAGNOSIS — Z87442 Personal history of urinary calculi: Secondary | ICD-10-CM | POA: Insufficient documentation

## 2013-08-23 DIAGNOSIS — F3289 Other specified depressive episodes: Secondary | ICD-10-CM | POA: Insufficient documentation

## 2013-08-23 DIAGNOSIS — F329 Major depressive disorder, single episode, unspecified: Secondary | ICD-10-CM | POA: Insufficient documentation

## 2013-08-23 MED ORDER — TRAMADOL HCL 50 MG PO TABS: 50.0000 mg | ORAL_TABLET | Freq: Four times a day (QID) | ORAL | Status: DC | PRN

## 2013-08-23 MED ORDER — CEPHALEXIN 250 MG PO CAPS: 500.0000 mg | ORAL_CAPSULE | Freq: Four times a day (QID) | ORAL | Status: DC

## 2013-08-23 MED ORDER — CEPHALEXIN 250 MG PO CAPS
250.0000 mg | ORAL_CAPSULE | Freq: Once | ORAL | Status: DC
  Filled 2013-08-23: qty 1

## 2013-08-23 MED ORDER — CEPHALEXIN 500 MG PO CAPS
500.0000 mg | ORAL_CAPSULE | Freq: Once | ORAL | Status: AC
  Administered 2013-08-23: 500 mg via ORAL
  Filled 2013-08-23: qty 1

## 2013-08-23 NOTE — ED Provider Notes (Signed)
CSN: [id number]     Arrival date & time 08/23/13  1833 History  This chart was scribed for non-physician practitioner, Arnoldo Hooker, PA-C, working with Merrie Roof, *, by Bronson Curb, ED Scribe. This patient was seen in room WTR7/WTR7 and the patient's care was started at 9:54 PM.    Chief Complaint  Patient presents with  . Hand Pain    The history is provided by the patient. No language interpreter was used.    HPI Comments: Randy Neal is a 38 y.o. male who presents to the Emergency Department complaining of burning left middle finger pain that began 2 weeks ago. Patient has a wart on his left middle finger and states his wife applied oregano oil to the site. He states the pain radiates to his left middle knuckle.There is associated discoloration and minimal drainage. Patient  He denies fever   Past Medical History  Diagnosis Date  . Kidney stone   . Depression    Past Surgical History  Procedure Laterality Date  . Kidney stone surgery Right 04/20/1994   No family history on file. History  Substance Use Topics  . Smoking status: Heavy Tobacco Smoker -- 0.50 packs/day for 15 years    Types: Cigarettes  . Smokeless tobacco: Never Used  . Alcohol Use: Yes     Comment: everyday    Review of Systems    Allergies  Doxycycline  Home Medications   Prior to Admission medications   Medication Sig Start Date End Date Taking? Authorizing Provider  buPROPion (WELLBUTRIN XL) 150 MG 24 hr tablet Take 1 tablet (150 mg total) by mouth daily. For depression 01/07/13  Yes Sanjuana Kava, NP  hydrOXYzine (ATARAX/VISTARIL) 50 MG tablet Take 1 tablet (50 mg total) by mouth at bedtime. For anxiety management 01/07/13  Yes Sanjuana Kava, NP  ibuprofen (ADVIL,MOTRIN) 200 MG tablet Take 600 mg by mouth every 6 (six) hours as needed for mild pain.   Yes Historical Provider, MD  lamoTRIgine (LAMICTAL) 25 MG tablet Take 1 tablet (25 mg total) by mouth daily. For mood  stabilization 01/07/13  Yes Sanjuana Kava, NP  LORazepam (ATIVAN) 1 MG tablet Take 1 tablet (1 mg total) by mouth 2 (two) times daily as needed for anxiety. 01/07/13  Yes Sanjuana Kava, NP  traZODone (DESYREL) 50 MG tablet Take 1 tablet (50 mg total) by mouth at bedtime and may repeat dose one time if needed. For sleep 01/07/13  Yes Sanjuana Kava, NP  Vilazodone HCl (VIIBRYD) 20 MG TABS Take 1 tablet (20 mg total) by mouth daily. For depression 01/07/13  Yes Sanjuana Kava, NP   Triage Vitals: BP 125/70  Pulse 76  Temp(Src) 97.4 F (36.3 C) (Oral)  SpO2 100%  Physical Exam  Nursing note and vitals reviewed. Constitutional: He is oriented to person, place, and time. He appears well-developed and well-nourished. No distress.  HENT:  Head: Normocephalic and atraumatic.  Eyes: Conjunctivae and EOM are normal.  Neck: Neck supple.  Cardiovascular: Normal rate.   Pulmonary/Chest: Effort normal.  Musculoskeletal: Normal range of motion.  Neurological: He is alert and oriented to person, place, and time.  Skin: Skin is warm and dry.  Left middle finger has a raised lesion adjacent tot he nail that is exceedingly tender no drainage. There is no surrounding redness. FROM in all joints of the finger  Psychiatric: He has a normal mood and affect. His behavior is normal.    ED  Course  Procedures (including critical care time)  DIAGNOSTIC STUDIES: Oxygen Saturation is 100% on room air, normal by my interpretation.    COORDINATION OF CARE: Labs Review Labs Reviewed - No data to display  Imaging Review No results found.   EKG Interpretation None      MDM   Final diagnoses:  None    1. Finger infection  No streaking or swelling of proximal finger. Nail intact without avulsion or hematoma. No drainable abscess. Will treat with Keflex, pain control and hand follow up. Return precautions given.   I personally performed the services described in this documentation, which was scribed  in my presence. The recorded information has been reviewed and is accurate.     Arnoldo HookerShari A Darl Kuss, PA-C 08/23/13 2212

## 2013-08-23 NOTE — ED Notes (Signed)
Wound care provided.

## 2013-08-23 NOTE — ED Notes (Signed)
Initial Contact - pt sitting up in chair, reports "wart" to L 3rd finger "for years", reports has been putting oregano oil on it x2 weeks with worsening of pain/swelling with serous drainage noted from finger.  Finger noted to be swollen, mildly red, tender.  Skin otherwise PWD.  A+Ox4.  Speaking full/clear sentences.  NAD.

## 2013-08-23 NOTE — ED Notes (Signed)
Pt states he has had a wart on his middle finger of left hand x several years.  States that his wife is into essential oils and talked him into putting oregano oil on his finger.  States he thinks it is infected.

## 2013-08-23 NOTE — ED Provider Notes (Signed)
Medical screening examination/treatment/procedure(s) were performed by non-physician practitioner and as supervising physician I was immediately available for consultation/collaboration.   EKG Interpretation None        Randy Neal David Dashiel Bergquist III, MD 08/23/13 2220 

## 2013-08-23 NOTE — Discharge Instructions (Signed)
Fingertip Infection °When an infection is around the nail, it is called a paronychia. When it appears over the tip of the finger, it is called a felon. These infections are due to minor injuries or cracks in the skin. If they are not treated properly, they can lead to bone infection and permanent damage to the fingernail. °Incision and drainage is necessary if a pus pocket (an abscess) has formed. Antibiotics and pain medicine may also be needed. Keep your hand elevated for the next 2-3 days to reduce swelling and pain. If a pack was placed in the abscess, it should be removed in 1-2 days by your caregiver. Soak the finger in warm water for 20 minutes 4 times daily to help promote drainage. °Keep the hands as dry as possible. Wear protective gloves with cotton liners. See your caregiver for follow-up care as recommended.  °HOME CARE INSTRUCTIONS  °· Keep wound clean, dry and dressed as suggested by your caregiver. °· Soak in warm salt water for fifteen minutes, four times per day for bacterial infections. °· Your caregiver will prescribe an antibiotic if a bacterial infection is suspected. Take antibiotics as directed and finish the prescription, even if the problem appears to be improving before the medicine is gone. °· Only take over-the-counter or prescription medicines for pain, discomfort, or fever as directed by your caregiver. °SEEK IMMEDIATE MEDICAL CARE IF: °· There is redness, swelling, or increasing pain in the wound. °· Pus or any other unusual drainage is coming from the wound. °· An unexplained oral temperature above 102° F (38.9° C) develops. °· You notice a foul smell coming from the wound or dressing. °MAKE SURE YOU:  °· Understand these instructions. °· Monitor your condition. °· Contact your caregiver if you are getting worse or not improving. °Document Released: 03/11/2004 Document Revised: 04/26/2011 Document Reviewed: 03/07/2008 °ExitCare® Patient Information ©2015 [employer]. This  information is not intended to replace advice given to you by your health care provider. Make sure you discuss any questions you have with your health care provider. ° °

## 2013-10-10 ENCOUNTER — Emergency Department (HOSPITAL_COMMUNITY)
Admission: EM | Admit: 2013-10-10 | Discharge: 2013-10-11 | Disposition: A | Discharge status: Home / Self Care | Payer: Self-pay | Attending: Emergency Medicine | Admitting: Emergency Medicine

## 2013-10-10 ENCOUNTER — Encounter (HOSPITAL_COMMUNITY): Payer: Self-pay | Admitting: Emergency Medicine

## 2013-10-10 DIAGNOSIS — R51 Headache: Secondary | ICD-10-CM | POA: Insufficient documentation

## 2013-10-10 DIAGNOSIS — Y9389 Activity, other specified: Secondary | ICD-10-CM | POA: Insufficient documentation

## 2013-10-10 DIAGNOSIS — T5894XA Toxic effect of carbon monoxide from unspecified source, undetermined, initial encounter: Secondary | ICD-10-CM

## 2013-10-10 DIAGNOSIS — R42 Dizziness and giddiness: Secondary | ICD-10-CM | POA: Insufficient documentation

## 2013-10-10 DIAGNOSIS — Z79899 Other long term (current) drug therapy: Secondary | ICD-10-CM | POA: Insufficient documentation

## 2013-10-10 DIAGNOSIS — F17200 Nicotine dependence, unspecified, uncomplicated: Secondary | ICD-10-CM | POA: Insufficient documentation

## 2013-10-10 DIAGNOSIS — Z87442 Personal history of urinary calculi: Secondary | ICD-10-CM | POA: Insufficient documentation

## 2013-10-10 DIAGNOSIS — Y9269 Other specified industrial and construction area as the place of occurrence of the external cause: Secondary | ICD-10-CM | POA: Insufficient documentation

## 2013-10-10 DIAGNOSIS — T5891XA Toxic effect of carbon monoxide from unspecified source, accidental (unintentional), initial encounter: Secondary | ICD-10-CM | POA: Insufficient documentation

## 2013-10-10 DIAGNOSIS — Z8659 Personal history of other mental and behavioral disorders: Secondary | ICD-10-CM | POA: Insufficient documentation

## 2013-10-10 DIAGNOSIS — Y93H3 Activity, building and construction: Secondary | ICD-10-CM | POA: Insufficient documentation

## 2013-10-10 DIAGNOSIS — R110 Nausea: Secondary | ICD-10-CM | POA: Insufficient documentation

## 2013-10-10 LAB — CARBOXYHEMOGLOBIN
CARBOXYHEMOGLOBIN: 17.8 % — AB (ref 0.5–1.5)
Methemoglobin: 1 % (ref 0.0–1.5)
O2 SAT: 93.1 %
Total hemoglobin: 14.3 g/dL (ref 13.5–18.0)

## 2013-10-10 NOTE — Discharge Instructions (Signed)
Carbon Monoxide Poisoning Carbon monoxide poisoning is illness caused by inhaling carbon monoxide gas. Carbon monoxide is a colorless, odorless gas. When the gas is inhaled, it quickly enters the bloodstream and reduces the amount of oxygen that goes to your cells. This decrease in oxygen received by the body tissues quickly becomes a life-threatening problem. Carbon monoxide poisoning is a medical emergency that requires immediate medical care. People who are elderly or who have heart disease or lung disease are more likely to have ill effects from carbon monoxide poisoning. CAUSES  Carbon monoxide poisoning is often caused by inhaling exhaust fumes from fuel burning sources. Burning any carbon-containing fuel (gasoline, coal, charcoal, wood) releases carbon monoxide into the air. Sources of carbon monoxide fumes include:  Motor exhaust from cars, motorcycles, and boats.  Cigarette smoke.  Propane-powered tools and vehicles.  Gas-powered tools and other industrial equipment. Without proper ventilation, carbon monoxide can build up in an enclosed or partially enclosed area. For example, if a chimney is not clear, a camping stove is used indoors, or a car is left running in a closed garage, this can lead to carbon monoxide poisoning. SYMPTOMS   Headache.  Dizziness.  Sleepiness.  Weakness.  Confusion.  Fainting.  Seizures.  Coma.  Nausea.  Vomiting.  Chest pain.  Shortness of breath. DIAGNOSIS  Your caregiver will probably suspect that you have carbon monoxide poisoning based on your symptoms and history of possible exposure. A blood test may be done to confirm the diagnosis. TREATMENT  Treatment involves getting to fresh air immediately and removing yourself from the dangerous environment. In the emergency room, you may be given oxygen therapy. Oxygen can be delivered through a face mask or breathing tubes that fit under your nostrils. In some severe cases, hyperbaric oxygen  therapy may be used. For this treatment, the person enters a chamber where oxygen is delivered under forced pressure. This speeds up the process in which oxygen is absorbed and replaces the carbon monoxide in the blood. PREVENTION   Install a carbon monoxide detector in your home.  Have all gas stoves and furnaces inspected once a year.  Clean all fireplace chimneys and flues at least once a year.  Have the exhaust system in your car checked once a year.  Never keep a car's motor running in a closed garage.  Never sleep in a car with the motor running.  Make sure all rooms that are heated with gasoline, coal, charcoal, or wood are properly ventilated. HOME CARE INSTRUCTIONS  Do not return to the area where you were exposed to carbon monoxide. The area must be thoroughly ventilated before it is safe to return. SEEK IMMEDIATE MEDICAL CARE IF: You suspect that a person has inhaled carbon monoxide gas. Remove the person from the area immediately. Call your local emergency services (911 in [place] Begin rescue breathing if the person is unconscious. MAKE SURE YOU:   Understand these instructions.  Will watch your condition.  Will get help right away if you are not doing well or get worse. Document Released: 01/30/2000 Document Revised: 08/03/2011 Document Reviewed: 04/22/2011 Exit[employer] Patient Information 2015 [employer]. This information is not intended to replace advice given to you by your health care provider. Make sure you discuss any questions you have with your health care provider.  

## 2013-10-10 NOTE — ED Notes (Signed)
Received call from poison control requesting update on pt's condition.   Pt st's headache has subsided at this time.  No complaints voiced.

## 2013-10-10 NOTE — ED Provider Notes (Signed)
CSN: [id number]     Arrival date & time 10/10/13  1907 History   First MD Initiated Contact with Patient 10/10/13 2050     Chief Complaint  Patient presents with  . 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  Patient says he started feeling lightheadedness, dizzy and nausea.     (Consider location/radiation/quality/duration/timing/severity/associated sxs/prior Treatment) HPI  Randy Neal is a 38 y.o. male with a past medical history of kidney stones, depression, presenting for headache, nausea, dizziness, and exposure to exhaust fumes. 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 A propane generator was being used on the work site, however it apparently did not have a functioning carbon monoxide alarm. Multiple workers on site began having symptoms of headache, dizziness, and nausea, and decided to leave the work site due to concern for carbon monoxide exposure. The patient went home, with his son who had been working with him. They're symptoms improve somewhat with rest however they continued to be symptomatic and presented for further evaluation. His son also came emergency department and is being evaluated another provider this time. Patient endorses having a "pounding headache" although he is still feeling better than he did earlier today. He denies any shortness of breath, chest pain, vomiting, difficulty walking, focal sensation change, focal muscle weakness, or other associated symptoms.     Past Medical History  Diagnosis Date  . Kidney stone   . Depression    Past Surgical History  Procedure Laterality Date  . Kidney stone surgery Right 04/20/1994   History reviewed. No pertinent family history. History  Substance Use Topics  . Smoking status: Heavy Tobacco Smoker -- 0.50 packs/day for 15 years    Types: Cigarettes  . Smokeless  tobacco: Never Used  . Alcohol Use: Yes     Comment: everyday    Review of Systems  Constitutional: Negative.   Eyes: Negative.   Respiratory: Negative.   Cardiovascular: Negative.   Gastrointestinal: Positive for nausea.  Endocrine: Negative.   Genitourinary: Negative.   Musculoskeletal: Negative.   Skin: Negative.   Allergic/Immunologic: Negative.   Neurological: Positive for dizziness, light-headedness and headaches.  Hematological: Negative.   Psychiatric/Behavioral: Negative.       Allergies  Doxycycline  Home Medications   Prior to Admission medications   Medication Sig Start Date End Date Taking? Authorizing Provider  nicotine (NICODERM CQ - DOSED IN MG/24 HOURS) 21 mg/24hr patch Place 21 mg onto the skin daily.   Yes Historical Provider, MD   BP 122/66  Pulse 40  Temp(Src) 98.3 F (36.8 C) (Oral)  Resp 17  SpO2 100% Physical Exam  Nursing note and vitals reviewed. Constitutional: He is oriented to person, place, and time. He appears well-developed and well-nourished. No distress.  HENT:  Head: Normocephalic and atraumatic.  Right Ear: External ear normal.  Left Ear: External ear normal.  Nose: Nose normal.  Mouth/Throat: Oropharynx is clear and moist. No oropharyngeal exudate.  Eyes: Conjunctivae and EOM are normal. Pupils are equal, round, and reactive to light. Right eye exhibits no discharge. Left eye exhibits no discharge. No scleral icterus.  Neck: Normal range of motion. Neck supple. No JVD present. No tracheal deviation present. No thyromegaly present.  Cardiovascular: Normal rate, regular rhythm, normal heart sounds and intact distal pulses.  Exam reveals no gallop and no friction rub.   No  murmur heard. Pulmonary/Chest: Effort normal and breath sounds normal. No stridor. No respiratory distress. He has no wheezes. He has no rales. He exhibits no tenderness.  Abdominal: Soft. Bowel sounds are normal. He exhibits no distension. There is no  tenderness. There is no rebound and no guarding.  Musculoskeletal: Normal range of motion. He exhibits no edema and no tenderness.  Lymphadenopathy:    He has no cervical adenopathy.  Neurological: He is alert and oriented to person, place, and time. He has normal strength and normal reflexes. He is not disoriented. He displays no atrophy, no tremor and normal reflexes. No cranial nerve deficit or sensory deficit. He exhibits normal muscle tone. He displays no seizure activity. Coordination and gait normal. GCS eye subscore is 4. GCS verbal subscore is 5. GCS motor subscore is 6.  Skin: Skin is warm and dry. No rash noted. He is not diaphoretic. No erythema. No pallor.  Psychiatric: He has a normal mood and affect. His behavior is normal. Judgment and thought content normal.    ED Course  Procedures (including critical care time) Labs Review Labs Reviewed  CARBOXYHEMOGLOBIN - Abnormal; Notable for the following:    Carboxyhemoglobin 17.8 (*)    All other components within normal limits    Imaging Review No results found.   EKG Interpretation None      MDM   Final diagnoses:  Carbon monoxide poisoning, initial encounter    Patient has no focal neurologic signs on exam, and is not ataxic. Symptoms consistent with carbon monoxide exposure, and carboxyhemoglobin obtained at triage is elevated.  Contacted [employer], and they advise oxygen administration, and observation until asymptomatic. Patient should be appropriate for discharge if asymptomatic, and without any focal neurologic findings.  Following high flow oxygen wall emergency department patient is now asymptomatic. Normal gait, and neurologic exam. Patient advised to return for acute worsening of symptoms.  Patient given return precautions for carbon monoxide poisoning.  Advised to return for worsening symptoms including chest pain, shortness of breath, severe headache, intractable nausea or vomiting, fever, or  chills, inability to take medications, or other acute concerns.  Advised to follow up with PCP as needed.  Patient in agreement with and expressed understanding of follow plan, plan of care, and return precautions.  All questions answered prior to discharge.  Patient was discharged in stable condition with family, ambulating without difficulty.  Patient care was discussed with my attending, Dr. Rubin Payor.   Gavin Pound, MD 10/12/13 0100

## 2013-10-10 NOTE — ED Notes (Signed)
After pt was placed on 02 via NRB mask at 15LPM  Pt st's his pain has improved to rated of 3 on pain scale 0/10.  Pt now sitting up on stretcher eating [place] sandwich.  Pt alert and oriented x's 3, skin warm and dry color appropriate

## 2013-10-10 NOTE — ED Notes (Signed)
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  Patient says he started feeling lightheadedness, dizzy and nausea.  He said he started feeling sick right after lunch.  Upon arrival his sats were 41.  Advised his headache is so severe he feels like his heartbeat is in his head.

## 2013-10-13 NOTE — ED Provider Notes (Signed)
I saw and evaluated the patient, reviewed the resident's note and I agree with the findings and plan.   EKG Interpretation None     Patient with carbon monoxide poisoning. Feels better after treatment. Level does not appear any hyperbaric oxygen or further treatment. Discussed with poison control. Will discharge home  Juliet Rude. Rubin Payor, MD 10/13/13 [phone]

## 2016-03-15 ENCOUNTER — Encounter: Payer: Self-pay | Admitting: Family Medicine

## 2016-03-15 ENCOUNTER — Ambulatory Visit (INDEPENDENT_AMBULATORY_CARE_PROVIDER_SITE_OTHER): Payer: Managed Care, Other (non HMO) | Admitting: Family Medicine

## 2016-03-15 VITALS — BP 120/82 | HR 64 | Temp 98.0°F | Ht 68.0 in | Wt 201.0 lb

## 2016-03-15 DIAGNOSIS — K219 Gastro-esophageal reflux disease without esophagitis: Secondary | ICD-10-CM | POA: Diagnosis not present

## 2016-03-15 DIAGNOSIS — Z720 Tobacco use: Secondary | ICD-10-CM | POA: Diagnosis not present

## 2016-03-15 DIAGNOSIS — Z8659 Personal history of other mental and behavioral disorders: Secondary | ICD-10-CM

## 2016-03-15 DIAGNOSIS — Z7689 Persons encountering health services in other specified circumstances: Secondary | ICD-10-CM | POA: Diagnosis not present

## 2016-03-15 DIAGNOSIS — Z23 Encounter for immunization: Secondary | ICD-10-CM | POA: Diagnosis not present

## 2016-03-15 NOTE — Progress Notes (Signed)
Pre visit review using our clinic review tool, if applicable. No additional management support is needed unless otherwise documented below in the visit note. 

## 2016-03-15 NOTE — Progress Notes (Signed)
Patient ID: Ritta SlotChristopher Todd Delsignore, male   DOB: 10/11/1975, 41 y.o.   MRN: [id number]  Patient presents to clinic today to establish care. He has not received regular care due to lack of insurance. He has a history of cocaine abuse, cannabis dependence, bipolar disorder, and tobacco abuse.   Acute Concerns: GERD:  He reports symptoms of reflux that occur on a daily basis and have been a concern on and off for at least 5 years. He denies N/V, dysphagia, unexplained weight loss, melena, hematochezia, and hematemesis. Reflex is controlled with Prilosec per patient report.  He has not seen a GI provider today.    Chronic Issues: Tobacco Abuse:  30 pack year history. He reports stopping for 3 months with nicotine patch previously but returned to smoking. He is interested in tobacco cessation as he is concerned about long term health.  He denies SOB, productive cough, or hematemesis.  History of Bipolar disorder:  Reports that he has been off his medication for one year due to working out of town. He states that he has been feeling "well" and he denies close follow up the [facility] where he has been treated previously. He states that he will [facility] the [facility] for scheduling an appointment for evaluation and discussion of medication long term. He denies depressed or anxious mood today.  Health Maintenance: Dental --Appointment 8 months ago.  Vision --Appointment 8 months.   Immunizations --Needs Influenza and Tdap today Colonoscopy --Not needed   Past Medical History:  Diagnosis Date  . Arthritis   . Depression   . GERD (gastroesophageal reflux disease)   . Kidney stone     Past Surgical History:  Procedure Laterality Date  . KIDNEY STONE REMOVAL    . KIDNEY STONE SURGERY Right 04/20/1994    No current outpatient prescriptions on file prior to visit.   No current facility-administered medications on file prior to visit.     Allergies  Allergen Reactions  .  Doxycycline Rash    Family History  Problem Relation Age of Onset  . Alcohol abuse Mother   . Diabetes Mother   . Alcohol abuse Father   . Arthritis Father   . Cancer Maternal Aunt   . Arthritis Maternal Grandmother   . Cancer Maternal Grandmother   . Cancer Maternal Grandfather     Social History   Social History  . Marital status: Married    Spouse name: N/A  . Number of children: N/A  . Years of education: N/A   Occupational History  . Not on file.   Social History Main Topics  . Smoking status: Heavy Tobacco Smoker    Packs/day: 0.50    Years: 15.00    Types: Cigarettes  . Smokeless tobacco: Current User  . Alcohol use Yes     Comment: everyday  . Drug use: Yes    Types: Marijuana, Cocaine  . Sexual activity: Yes    Birth control/ protection: Condom   Other Topics Concern  . Not on file   Social History Narrative  . No narrative on file    ROS  BP 120/82 (BP Location: Left Arm, Patient Position: Sitting, Cuff Size: Normal)   Pulse 64   Temp 98 F (36.7 C) (Oral)   Ht 5\' 8"  (1.727 m)   Wt 201 lb (91.2 kg)   SpO2 (!) 64%   BMI 30.56 kg/m   Physical Exam  No results found for this or any previous visit (  from the past 2160 hour(s)).  Assessment/Plan: 1. Gastroesophageal reflux disease, esophagitis presence not specified Exam and symptoms are reassuring; trial of aggressive lifestyle management with prilosec to be taken 30 minutes prior to meal.  If symptoms are not controlled with modifications will consider upper endoscopy for further evaluation and treatment.  - Basic metabolic panel - CBC with Differential/Platelet - Lipid panel - TSH - Hepatic function panel  2. Tobacco abuse Tobacco Cessation- I have discussed the risks of tobacco use with the patient, which includes but are not limited to lung, throat,stomach, pancreatic, and colon cancer,as well as COPD. The type and daily amount of tobacco used, as well as the patients willingness to  quit were accessed. I discussed with the patient the different medical interventions, their usefulness, effectiveness and side effects. Pt declines medical intervention at this time but will consider nicotine patch as we discussed. Will continue to discuss in further visits and patient encouraged to return if they need assistance with cessation in the future.   3. History of bipolar disorder No report of anxious or depressed mood today; advised follow up with [facility] for further evaluation and management with long term history and report of being off of medications.   4. History of depression PHQ-2: 0 today; denies depressed or anxious mood; follow up with [facility] or [facility]  5. Encounter to establish care We reviewed the PMH, PSH, FH, SH, Meds and Allergies. -We provided refills for any medications we will prescribe as needed. -We addressed current concerns per orders and patient instructions. -We have asked for records for pertinent exams, studies, vaccines and notes from previous providers. -We have advised patient to follow up per instructions below.   6. Need for diphtheria-tetanus-pertussis (Tdap) vaccine  - Tdap vaccine greater than or equal to 7yo IM  7. Encounter for immunization  - Flu Vaccine QUAD 36+ mos IM  Advised follow up for physical in one to two weeks with lab work. We discussed screening for lung cancer with a low dose CT scan to be scheduled. He will consider this and we will discuss at his physical within one week.  Roddie Mc, FNP-C

## 2016-03-15 NOTE — Patient Instructions (Signed)
It was a pleasure to meet you today.   We have ordered labs or studies at this visit. It can take up to 1-2 weeks for results and processing. IF results require follow up or explanation, we will call you with instructions. Clinically stable results will be released to your [facility]. If you have not heard from us or cannot find your results in [facility] in 2 weeks please contact our office at [phone].  If you are not yet signed up for [facility], please consider signing up    Please schedule a physical at your convenience.   Continue prilosec for symptoms with avoidance of triggers. Great job cutting back on cigarettes!   Food Choices for Gastroesophageal Reflux Disease, Adult When you have gastroesophageal reflux disease (GERD), the foods you eat and your eating habits are very important. Choosing the right foods can help ease your discomfort. What guidelines do I need to follow?  Choose fruits, vegetables, whole grains, and low-fat dairy products.  Choose low-fat meat, fish, and poultry.  Limit fats such as oils, salad dressings, butter, nuts, and avocado.  Keep a food diary. This helps you identify foods that cause symptoms.  Avoid foods that cause symptoms. These may be different for everyone.  Eat small meals often instead of 3 large meals a day.  Eat your meals slowly, in a place where you are relaxed.  Limit fried foods.  Cook foods using methods other than frying.  Avoid drinking alcohol.  Avoid drinking large amounts of liquids with your meals.  Avoid bending over or lying down until 2-3 hours after eating. What foods are not recommended? These are some foods and drinks that may make your symptoms worse: Vegetables  Tomatoes. Tomato juice. Tomato and spaghetti sauce. Chili peppers. Onion and garlic. Horseradish. Fruits  Oranges, grapefruit, and lemon (fruit and juice). Meats  High-fat meats, fish, and poultry. This includes hot dogs, ribs, ham, sausage, salami,  and bacon. Dairy  Whole milk and chocolate milk. Sour cream. Cream. Butter. Ice cream. Cream cheese. Drinks  Coffee and tea. Bubbly (carbonated) drinks or energy drinks. Condiments  Hot sauce. Barbecue sauce. Sweets/Desserts  Chocolate and cocoa. Donuts. Peppermint and spearmint. Fats and Oils  High-fat foods. This includes [place] fries and potato chips. Other  Vinegar. Strong spices. This includes black pepper, white pepper, red pepper, cayenne, curry powder, cloves, ginger, and chili powder. The items listed above may not be a complete list of foods and drinks to avoid. Contact your dietitian for more information.  This information is not intended to replace advice given to you by your health care provider. Make sure you discuss any questions you have with your health care provider. Document Released: 08/03/2011 Document Revised: 07/10/2015 Document Reviewed: 12/06/2012 Elsevier Interactive Patient Education  2017 [employer]

## 2016-03-18 ENCOUNTER — Ambulatory Visit (INDEPENDENT_AMBULATORY_CARE_PROVIDER_SITE_OTHER): Payer: Managed Care, Other (non HMO) | Admitting: Family Medicine

## 2016-03-18 ENCOUNTER — Encounter: Payer: Self-pay | Admitting: Family Medicine

## 2016-03-18 VITALS — BP 112/68 | Temp 98.3°F | Ht 68.0 in | Wt 202.0 lb

## 2016-03-18 DIAGNOSIS — Z720 Tobacco use: Secondary | ICD-10-CM

## 2016-03-18 DIAGNOSIS — K219 Gastro-esophageal reflux disease without esophagitis: Secondary | ICD-10-CM

## 2016-03-18 DIAGNOSIS — Z8739 Personal history of other diseases of the musculoskeletal system and connective tissue: Secondary | ICD-10-CM | POA: Diagnosis not present

## 2016-03-18 DIAGNOSIS — Z122 Encounter for screening for malignant neoplasm of respiratory organs: Secondary | ICD-10-CM

## 2016-03-18 DIAGNOSIS — Z0001 Encounter for general adult medical examination with abnormal findings: Secondary | ICD-10-CM

## 2016-03-18 DIAGNOSIS — Z8659 Personal history of other mental and behavioral disorders: Secondary | ICD-10-CM

## 2016-03-18 LAB — CBC WITH DIFFERENTIAL/PLATELET
BASOS ABS: 0.1 10*3/uL (ref 0.0–0.1)
Basophils Relative: 1.7 % (ref 0.0–3.0)
Eosinophils Absolute: 0.3 10*3/uL (ref 0.0–0.7)
Eosinophils Relative: 6.8 % — ABNORMAL HIGH (ref 0.0–5.0)
HCT: 43.7 % (ref 39.0–52.0)
Hemoglobin: 15.1 g/dL (ref 13.0–17.0)
LYMPHS ABS: 1.7 10*3/uL (ref 0.7–4.0)
LYMPHS PCT: 37.2 % (ref 12.0–46.0)
MCHC: 34.5 g/dL (ref 30.0–36.0)
MCV: 89.9 fl (ref 78.0–100.0)
Monocytes Absolute: 0.3 10*3/uL (ref 0.1–1.0)
Monocytes Relative: 7.2 % (ref 3.0–12.0)
NEUTROS ABS: 2.2 10*3/uL (ref 1.4–7.7)
NEUTROS PCT: 47.1 % (ref 43.0–77.0)
PLATELETS: 235 10*3/uL (ref 150.0–400.0)
RBC: 4.85 Mil/uL (ref 4.22–5.81)
RDW: 13.2 % (ref 11.5–15.5)
WBC: 4.7 10*3/uL (ref 4.0–10.5)

## 2016-03-18 LAB — LIPID PANEL
CHOL/HDL RATIO: 7
Cholesterol: 218 mg/dL — ABNORMAL HIGH (ref 0–200)
HDL: 29.7 mg/dL — AB (ref >39.00)
LDL Cholesterol: 165 mg/dL — ABNORMAL HIGH (ref 0–99)
NONHDL: 188.13
Triglycerides: 116 mg/dL (ref 0.0–149.0)
VLDL: 23.2 mg/dL (ref 0.0–40.0)

## 2016-03-18 LAB — BASIC METABOLIC PANEL
BUN: 17 mg/dL (ref 6–23)
CHLORIDE: 109 meq/L (ref 96–112)
CO2: 28 mEq/L (ref 19–32)
Calcium: 9.3 mg/dL (ref 8.4–10.5)
Creatinine, Ser: 0.96 mg/dL (ref 0.40–1.50)
GFR: 91.34 mL/min (ref >60.00)
Glucose, Bld: 93 mg/dL (ref 70–99)
Potassium: 4.3 mEq/L (ref 3.5–5.1)
Sodium: 140 mEq/L (ref 135–145)

## 2016-03-18 LAB — HEPATIC FUNCTION PANEL
ALBUMIN: 4.3 g/dL (ref 3.5–5.2)
ALK PHOS: 72 U/L (ref 39–117)
ALT: 29 U/L (ref 0–53)
AST: 15 U/L (ref 0–37)
BILIRUBIN DIRECT: 0.1 mg/dL (ref 0.0–0.3)
Total Bilirubin: 0.4 mg/dL (ref 0.2–1.2)
Total Protein: 6.3 g/dL (ref 6.0–8.3)

## 2016-03-18 LAB — HEMOGLOBIN A1C: HEMOGLOBIN A1C: 5.5 % (ref 4.6–6.5)

## 2016-03-18 LAB — TSH: TSH: 4.57 u[IU]/mL — AB (ref 0.35–4.50)

## 2016-03-18 MED ORDER — PANTOPRAZOLE SODIUM 40 MG PO TBEC: 40.0000 mg | DELAYED_RELEASE_TABLET | Freq: Every day | ORAL | 3 refills | Status: DC

## 2016-03-18 MED ORDER — DICLOFENAC SODIUM 1 % TD GEL: 2.0000 g | Freq: Four times a day (QID) | TRANSDERMAL | 0 refills | Status: DC

## 2016-03-18 NOTE — Patient Instructions (Signed)
We have ordered labs or studies at this visit. It can take up to 1-2 weeks for results and processing. IF results require follow up or explanation, we will call you with instructions. Clinically stable results will be released to your [facility]. If you have not heard from [place] or cannot find your results in [facility] in 2 weeks please contact our office at [phone].  If you are not yet signed up for [facility], please consider signing up   Please consider heart healthy foods.  Heart-Healthy Eating Plan Introduction Heart-healthy meal planning includes:  Limiting unhealthy fats.  Increasing healthy fats.  Making other small dietary changes. You may need to talk with your doctor or a diet specialist (dietitian) to create an eating plan that is right for you. What types of fat should I choose?  Choose healthy fats. These include olive oil and canola oil, flaxseeds, walnuts, almonds, and seeds.  Eat more omega-3 fats. These include salmon, mackerel, sardines, tuna, flaxseed oil, and ground flaxseeds. Try to eat fish at least twice each week.  Limit saturated fats.  Saturated fats are often found in animal products, such as meats, butter, and cream.  Plant sources of saturated fats include palm oil, palm kernel oil, and coconut oil.  Avoid foods with partially hydrogenated oils in them. These include stick margarine, some tub margarines, cookies, crackers, and other baked goods. These contain trans fats. What general guidelines do I need to follow?  Check food labels carefully. Identify foods with trans fats or high amounts of saturated fat.  Fill one half of your plate with vegetables and green salads. Eat 4-5 servings of vegetables per day. A serving of vegetables is:  1 cup of raw leafy vegetables.   cup of raw or cooked cut-up vegetables.   cup of vegetable juice.  Fill one fourth of your plate with whole grains. Look for the word "whole" as the first word in the ingredient  list.  Fill one fourth of your plate with lean protein foods.  Eat 4-5 servings of fruit per day. A serving of fruit is:  One medium whole fruit.   cup of dried fruit.   cup of fresh, frozen, or canned fruit.   cup of 100% fruit juice.  Eat more foods that contain soluble fiber. These include apples, broccoli, carrots, beans, peas, and barley. Try to get 20-30 g of fiber per day.  Eat more home-cooked food. Eat less restaurant, buffet, and fast food.  Limit or avoid alcohol.  Limit foods high in starch and sugar.  Avoid fried foods.  Avoid frying your food. Try baking, boiling, grilling, or broiling it instead. You can also reduce fat by:  Removing the skin from poultry.  Removing all visible fats from meats.  Skimming the fat off of stews, soups, and gravies before serving them.  Steaming vegetables in water or broth.  Lose weight if you are overweight.  Eat 4-5 servings of nuts, legumes, and seeds per week:  One serving of dried beans or legumes equals  cup after being cooked.  One serving of nuts equals 1 ounces.  One serving of seeds equals  ounce or one tablespoon.  You may need to keep track of how much salt or sodium you eat. This is especially true if you have high blood pressure. Talk with your doctor or dietitian to get more information. What foods can I eat? Grains  Breads, including [place], white, pita, wheat, raisin, rye, oatmeal, and [place]. Tortillas that are neither  fried nor made with lard or trans fat. Low-fat rolls, including hotdog and hamburger buns and English muffins. Biscuits. Muffins. Waffles. Pancakes. Light popcorn. Whole-grain cereals. Flatbread. Melba toast. Pretzels. Breadsticks. Rusks. Low-fat snacks. Low-fat crackers, including oyster, saltine, matzo, graham, animal, and rye. Rice and pasta, including brown rice and pastas that are made with whole wheat. Vegetables  All vegetables. Fruits  All fruits, but limit coconut. Meats  and Other Protein Sources  Lean, well-trimmed beef, veal, pork, and lamb. Chicken and [place] without skin. All fish and shellfish. Wild duck, rabbit, pheasant, and venison. Egg whites or low-cholesterol egg substitutes. Dried beans, peas, lentils, and tofu. Seeds and most nuts. Dairy  Low-fat or nonfat cheeses, including ricotta, string, and mozzarella. Skim or 1% milk that is liquid, powdered, or evaporated. Buttermilk that is made with low-fat milk. Nonfat or low-fat yogurt. Beverages  Mineral water. Diet carbonated beverages. Sweets and Desserts  Sherbets and fruit ices. Honey, jam, marmalade, jelly, and syrups. Meringues and gelatins. Pure sugar candy, such as hard candy, jelly beans, gumdrops, mints, marshmallows, and small amounts of dark chocolate. [employer] food cake. Eat all sweets and desserts in moderation. Fats and Oils  Nonhydrogenated (trans-free) margarines. Vegetable oils, including soybean, sesame, sunflower, olive, peanut, safflower, corn, canola, and cottonseed. Salad dressings or mayonnaise made with a vegetable oil. Limit added fats and oils that you use for cooking, baking, salads, and as spreads. Other  Cocoa powder. Coffee and tea. All seasonings and condiments. The items listed above may not be a complete list of recommended foods or beverages. Contact your dietitian for more options.  What foods are not recommended? Grains  Breads that are made with saturated or trans fats, oils, or whole milk. Croissants. Butter rolls. Cheese breads. Sweet rolls. Donuts. Buttered popcorn. Chow mein noodles. High-fat crackers, such as cheese or butter crackers. Meats and Other Protein Sources  Fatty meats, such as hotdogs, short ribs, sausage, spareribs, bacon, rib eye roast or steak, and mutton. High-fat deli meats, such as salami and bologna. Caviar. Domestic duck and goose. Organ meats, such as kidney, liver, sweetbreads, and heart. Dairy  Cream, sour cream, cream cheese, and creamed  cottage cheese. Whole-milk cheeses, including blue (bleu), [address], [place]rie, [place], [address], [place], [address], cheddar, [place] Ranch[place]embert, and [place]. Whole or 2% milk that is liquid, evaporated, or condensed. Whole buttermilk. Cream sauce or high-fat cheese sauce. Yogurt that is made from whole milk. Beverages  Regular sodas and juice drinks with added sugar. Sweets and Desserts  Frosting. Pudding. Cookies. Cakes other than angel food cake. Candy that has milk chocolate or white chocolate, hydrogenated fat, butter, coconut, or unknown ingredients. Buttered syrups. Full-fat ice cream or ice cream drinks. Fats and Oils  Gravy that has suet, meat fat, or shortening. Cocoa butter, hydrogenated oils, palm oil, coconut oil, palm kernel oil. These can often be found in baked products, candy, fried foods, nondairy creamers, and whipped toppings. Solid fats and shortenings, including bacon fat, salt pork, lard, and butter. Nondairy cream substitutes, such as coffee creamers and sour cream substitutes. Salad dressings that are made of unknown oils, cheese, or sour cream. The items listed above may not be a complete list of foods and beverages to avoid. Contact your dietitian for more information.  This information is not intended to replace advice given to you by your health care provider. Make sure you discuss any questions you have with your health care provider. Document Released: 08/03/2011 Document Revised: 07/10/2015 Document Reviewed: 07/26/2013  2017 Elsevier

## 2016-03-18 NOTE — Progress Notes (Signed)
Pre visit review using our clinic review tool, if applicable. No additional management support is needed unless otherwise documented below in the visit note. 

## 2016-03-18 NOTE — Progress Notes (Signed)
Subjective:    Patient ID: Randy Neal, male    DOB: 04/05/1975, 41 y.o.   MRN: [id number]  HPI  Mr. Randy Neal is a 41 year old male who presents for a routine physical exam.  He has a history of cocaine abuse, cannabis dependence, bipolar disorder, and tobacco abuse.   Reviewed health maintenance protocols including  Colonoscopy, and reviewed needed appropriate screening labs.  His immunization history was reviewed as well as his current medications and allergies.  All orders and referrals were made as appropriate.  Review of Systems  Constitutional: Negative for chills and fever.  HENT: Negative for congestion, postnasal drip, rhinorrhea, sinus pain, sinus pressure, sneezing and sore throat.   Respiratory: Negative for cough, shortness of breath and wheezing.   Cardiovascular: Negative for chest pain and palpitations.  Gastrointestinal: Negative for abdominal pain, diarrhea, nausea and vomiting.  Musculoskeletal:       Low back and hip pain  Skin: Negative for rash.  Neurological: Negative for dizziness, light-headedness, numbness and headaches.  Psychiatric/Behavioral:       Denies depressed and anxious mood   Past Medical History:  Diagnosis Date  . Arthritis   . Depression   . GERD (gastroesophageal reflux disease)   . Kidney stone      Social History   Social History  . Marital status: Single    Spouse name: N/A  . Number of children: N/A  . Years of education: N/A   Occupational History  . Holiday representativeConstruction Work    Social History Main Topics  . Smoking status: Heavy Tobacco Smoker    Packs/day: 2.00    Years: 15.00    Types: Cigarettes  . Smokeless tobacco: Current User  . Alcohol use 1.8 oz/week    3 Cans of beer per week     Comment: everyday  . Drug use: Yes    Types: Marijuana, Cocaine  . Sexual activity: Yes    Birth control/ protection: Condom   Other Topics Concern  . Not on file   Social History Narrative  . No narrative on file     Past Surgical History:  Procedure Laterality Date  . KIDNEY STONE REMOVAL    . KIDNEY STONE SURGERY Right 04/20/1994    Family History  Problem Relation Age of Onset  . Alcohol abuse Mother   . Diabetes Mother   . Alcohol abuse Father   . Arthritis Father   . Cancer Maternal Aunt   . Arthritis Maternal Grandmother   . Cancer Maternal Grandmother   . Cancer Maternal Grandfather     Allergies  Allergen Reactions  . Doxycycline Rash    Current Outpatient Prescriptions on File Prior to Visit  Medication Sig Dispense Refill  . omeprazole (PRILOSEC) 20 MG capsule Take 20 mg by mouth daily.     No current facility-administered medications on file prior to visit.     BP 112/68   Temp 98.3 F (36.8 C)   Ht 5\' 8"  (1.727 m)   Wt 202 lb (91.6 kg)   BMI 30.71 kg/m      Objective:   Physical Exam  Constitutional: He is oriented to person, place, and time. He appears well-developed and well-nourished.  Eyes: Pupils are equal, round, and reactive to light. No scleral icterus.  Neck: Neck supple.  Cardiovascular: Normal rate, regular rhythm and intact distal pulses.   Pulmonary/Chest: Effort normal and breath sounds normal. He has no wheezes. He has no rales.  Abdominal: Soft. Bowel sounds  are normal. There is no tenderness.  Musculoskeletal: He exhibits no edema.  Spine with normal alignment and no deformity. No tenderness to vertebral process with palpation with the exception of mild tenderness around L5.  Paraspinous muscles are tender and patient notes this after building a deck this week. No tenderness to palpation over greater trochanter. Hip has full ROM. ROM is full at lumbar sacral regions. Negative Straight Leg raise. No CVA tenderness present. Able to heel/toe walk without pain.  Lymphadenopathy:    He has no cervical adenopathy.  Neurological: He is alert and oriented to person, place, and time. He has normal strength. No sensory deficit. He displays a negative  Romberg sign.  II-Visual fields grossly intact. III/IV/VI-Extraocular movements intact. Pupils reactive bilaterally. V/VII-Smile symmetric, equal eyebrow raise, facial sensation intact VIII- Hearing grossly intact XI-bilateral shoulder shrug Motor: 5/5 bilaterally with normal tone and bulk Cerebellar: Normal finger-to-nose   Romberg negative Ambulates with a coordinated gait   Skin: Skin is warm and dry. No rash noted.  Psychiatric: He has a normal mood and affect. His behavior is normal. Judgment and thought content normal.          Assessment & Plan:  1. Encounter for general adult medical examination with abnormal findings 41 y.o. male presenting for annual physical.  Health Maintenance counseling: 1. Anticipatory guidance: Patient counseled regarding regular dental exams, eye exams, wearing seatbelts.  2. Risk factor reduction:  Advised patient of need for regular exercise and diet rich and fruits and vegetables to reduce risk of heart attack and stroke.  3. Immunizations/screenings/ancillary studies Immunization History  Administered Date(s) Administered  . Influenza,inj,Quad PF,36+ Mos 03/15/2016  . Tdap 03/15/2016    4. Prostate cancer screening- Declined; no symptoms today  5. Colon cancer screening: Not needed 6. Skin cancer screening- Use of sunscreen recommended with monitoring of any nevi changes. Education provided  2. Gastroesophageal reflux disease, esophagitis presence not specified Not controlled with prilosec; trial of protonix for 8 weeks with avoidance of food triggers. If symptoms persist, will consider referral to GI for upper endoscopy  3. Personal history of arthritis Tenderness noted upon palpation of paraspinous muscles and report of tenderness to hip area by patient. Trial of diclofenac gel and we discussed further evaluation of pain if symptoms persist. - diclofenac sodium (VOLTAREN) 1 % GEL; Apply 2 g topically 4 (four) times daily.  Dispense:  100 g; Refill: 0  4. Tobacco abuse Tobacco Cessation- I have discussed the risks of tobacco use with the patient, which includes but are not limited to lung, throat,stomach, pancreatic, and colon cancer,as well as COPD. The type and daily amount of tobacco used, as well as the patients willingness to quit were accessed. I discussed with the patient the different medical interventions, their usefulness, effectiveness and side effects. Pt declines  medical intervention at this time.  He has agreed to low dose CT scan for lung cancer screening at this time. Will continue to discuss in further visits and patient encouraged to return if they need assistance with cessation in the future.    5. Encounter for screening for lung cancer 30 pack year history and family history of father with lung cancer. Advise screening  - CT CHEST LUNG CANCER SCREENING LOW DOSE WO CONTRAST; Future   6. History of depression Denies depressed or anxious mood today. We discussed [facility] as an option for evaluation and management if needed.   Follow up will be determined based upon review of lab results.  Delano Metz, FNP-C

## 2016-03-19 LAB — HEPATITIS C ANTIBODY: HCV Ab: NEGATIVE

## 2016-03-19 LAB — HIV ANTIBODY (ROUTINE TESTING W REFLEX): HIV 1&2 Ab, 4th Generation: NONREACTIVE

## 2016-03-22 ENCOUNTER — Other Ambulatory Visit: Payer: Self-pay | Admitting: Family Medicine

## 2016-03-22 DIAGNOSIS — R7989 Other specified abnormal findings of blood chemistry: Secondary | ICD-10-CM

## 2016-11-04 ENCOUNTER — Encounter: Payer: Self-pay | Admitting: Family Medicine

## 2016-11-24 ENCOUNTER — Ambulatory Visit (INDEPENDENT_AMBULATORY_CARE_PROVIDER_SITE_OTHER): Payer: 59 | Admitting: Family Medicine

## 2016-11-24 ENCOUNTER — Encounter: Payer: Self-pay | Admitting: Physician Assistant

## 2016-11-24 ENCOUNTER — Encounter: Payer: Self-pay | Admitting: Family Medicine

## 2016-11-24 VITALS — BP 100/70 | HR 74 | Temp 98.6°F | Resp 16 | Ht 68.0 in | Wt 207.0 lb

## 2016-11-24 DIAGNOSIS — L0291 Cutaneous abscess, unspecified: Secondary | ICD-10-CM

## 2016-11-24 DIAGNOSIS — R1013 Epigastric pain: Secondary | ICD-10-CM | POA: Diagnosis not present

## 2016-11-24 DIAGNOSIS — F319 Bipolar disorder, unspecified: Secondary | ICD-10-CM | POA: Diagnosis not present

## 2016-11-24 NOTE — Patient Instructions (Signed)
Food Choices for Gastroesophageal Reflux Disease, Adult When you have gastroesophageal reflux disease (GERD), the foods you eat and your eating habits are very important. Choosing the right foods can help ease the discomfort of GERD. Consider working with a diet and nutrition specialist (dietitian) to help you make healthy food choices. What general guidelines should I follow? Eating plan  Choose healthy foods low in fat, such as fruits, vegetables, whole grains, low-fat dairy products, and lean meat, fish, and poultry.  Eat frequent, small meals instead of three large meals each day. Eat your meals slowly, in a relaxed setting. Avoid bending over or lying down until 2-3 hours after eating.  Limit high-fat foods such as fatty meats or fried foods.  Limit your intake of oils, butter, and shortening to less than 8 teaspoons each day.  Avoid the following: ? Foods that cause symptoms. These may be different for different people. Keep a food diary to keep track of foods that cause symptoms. ? Alcohol. ? Drinking large amounts of liquid with meals. ? Eating meals during the 2-3 hours before bed.  Cook foods using methods other than frying. This may include baking, grilling, or broiling. Lifestyle   Maintain a healthy weight. Ask your health care provider what weight is healthy for you. If you need to lose weight, work with your health care provider to do so safely.  Exercise for at least 30 minutes on 5 or more days each week, or as told by your health care provider.  Avoid wearing clothes that fit tightly around your waist and chest.  Do not use any products that contain nicotine or tobacco, such as cigarettes and e-cigarettes. If you need help quitting, ask your health care provider.  Sleep with the head of your bed raised. Use a wedge under the mattress or blocks under the bed frame to raise the head of the bed. What foods are not recommended? The items listed may not be a complete  list. Talk with your dietitian about what dietary choices are best for you. Grains Pastries or quick breads with added fat. French toast. Vegetables Deep fried vegetables. French fries. Any vegetables prepared with added fat. Any vegetables that cause symptoms. For some people this may include tomatoes and tomato products, chili peppers, onions and garlic, and horseradish. Fruits Any fruits prepared with added fat. Any fruits that cause symptoms. For some people this may include citrus fruits, such as oranges, grapefruit, pineapple, and lemons. Meats and other protein foods High-fat meats, such as fatty beef or pork, hot dogs, ribs, ham, sausage, salami and bacon. Fried meat or protein, including fried fish and fried chicken. Nuts and nut butters. Dairy Whole milk and chocolate milk. Sour cream. Cream. Ice cream. Cream cheese. Milk shakes. Beverages Coffee and tea, with or without caffeine. Carbonated beverages. Sodas. Energy drinks. Fruit juice made with acidic fruits (such as [place] or grapefruit). Tomato juice. Alcoholic drinks. Fats and oils Butter. Margarine. Shortening. Ghee. Sweets and desserts Chocolate and cocoa. Donuts. Seasoning and other foods Pepper. Peppermint and spearmint. Any condiments, herbs, or seasonings that cause symptoms. For some people, this may include curry, hot sauce, or vinegar-based salad dressings. Summary  When you have gastroesophageal reflux disease (GERD), food and lifestyle choices are very important to help ease the discomfort of GERD.  Eat frequent, small meals instead of three large meals each day. Eat your meals slowly, in a relaxed setting. Avoid bending over or lying down until 2-3 hours after eating.  Limit high-fat   foods such as fatty meat or fried foods. This information is not intended to replace advice given to you by your health care provider. Make sure you discuss any questions you have with your health care provider. Document Released:  02/01/2005 Document Revised: 02/03/2016 Document Reviewed: 02/03/2016 Elsevier Interactive Patient Education  2017 [employer].  We will set up GI referral.   Consider using Lever 2000 soap.

## 2016-11-24 NOTE — Progress Notes (Signed)
Subjective:     Patient ID: Randy Neal, male   DOB: 03/01/1975, 41 y.o.   MRN: [id number]  HPI Patient is seen basically transferring care within our practice. Had previously seen Randy Neal. We discussed several issues today as below. His past medical history is significant for bipolar disorder and remote history of substance abuse. He states he has been free of substance abuse for many years now.  History bipolar disorder. Has been followed at the [facility] and is trying to get back established with care there (not seen in > one year). He apparently has been off medication for about a year now. He states he has frequent "mood swings ". He has previously been on multiple medications including Lamictal,, Abilify, and Wellbutrin. Denies any current depressive symptoms.  Patient has history of recurrent skin abscesses. He's been to urgent care several times. He states he has been given antibiotics for "MRSA". He has not actually been cultured. Most of his abscesses have been on the hands and forearms. He just recently completed course of clindamycin and states that was combined with some other type antibiotic and is not sure which type. He had recent recurrent pustule of left hand last week and that is slowly clearing. No fevers or chills.  Patient relates several month history of epigastric pain and frequent burping. No appetite or weight changes. No history of peptic ulcer disease. No hematemesis. No melena. He states he has almost daily reflux symptoms and burning epigastric region. No nonsteroidal use. No aspirin use. Does drink beer but does not drink daily. Usually a few times per week. Usually 2-6 beers per episode.  Was treated with Protonix for several weeks without any improvement. Currently takes omeprazole 20 mg 2 daily along with frequent over-the-counter antacids without much improvement. No dysphagia. Denies any chest pain. No exertional symptoms. No dyspnea.  Past Medical  History:  Diagnosis Date  . Arthritis   . Depression   . GERD (gastroesophageal reflux disease)   . Kidney stone    Past Surgical History:  Procedure Laterality Date  . KIDNEY STONE REMOVAL    . KIDNEY STONE SURGERY Right 04/20/1994    reports that he has been smoking Cigarettes.  He has a 30.00 pack-year smoking history. He uses smokeless tobacco. He reports that he drinks about 1.8 oz of alcohol per week . He reports that he uses drugs, including Marijuana and Cocaine. family history includes Alcohol abuse in his father and mother; Arthritis in his father and maternal grandmother; Cancer in his maternal aunt, maternal grandfather, and maternal grandmother; Diabetes in his mother; Lung cancer in his father. Allergies  Allergen Reactions  . Doxycycline Rash     Review of Systems  Constitutional: Negative for appetite change, chills, fever and unexpected weight change.  HENT: Negative for trouble swallowing.   Respiratory: Negative for cough and shortness of breath.   Cardiovascular: Negative for chest pain, palpitations and leg swelling.  Gastrointestinal: Positive for abdominal pain. Negative for abdominal distention, blood in stool, constipation, diarrhea, nausea and vomiting.  Genitourinary: Negative for dysuria.  Musculoskeletal: Negative for back pain.       Objective:   Physical Exam  Constitutional: He appears well-developed and well-nourished.  Cardiovascular: Normal rate and regular rhythm.   Pulmonary/Chest: Effort normal and breath sounds normal. No respiratory distress. He has no wheezes. He has no rales.  Abdominal: Soft. Bowel sounds are normal. He exhibits no distension. There is no tenderness. There is no rebound and no guarding.  Skin:  Dorsum left hand reveals indurated area minimal erythema about 1 cm diameter.       Assessment:     #1 history of bipolar disorder currently not followed by psychiatry and patient trying to get back in to reestablish  #2  recurrent skin abscesses  #3 persistent epigastric pain not improved with proton pump inhibitor. He has had pain for many months. Frequent associated burping. He does not have any red flags such as appetite change, weight loss, dysphagia    Plan:     -Recommend consider good antibacterial soap such as Dial or Lever 2000  -Follow-up immediately for any recurrent abscesses and especially for any associated fevers or chills or cellulitis changes -Strongly encouraged to continue close follow-up with psychiatry regarding his bipolar disorder -Recommend GI evaluation regarding his persistent epigastric pain -encouraged to get back in for any recurrent acute abscess to culture.  Randy Covey MD [facility]

## 2016-12-07 ENCOUNTER — Encounter: Payer: Self-pay | Admitting: Physician Assistant

## 2016-12-07 ENCOUNTER — Ambulatory Visit (INDEPENDENT_AMBULATORY_CARE_PROVIDER_SITE_OTHER): Payer: 59 | Admitting: Physician Assistant

## 2016-12-07 ENCOUNTER — Other Ambulatory Visit (INDEPENDENT_AMBULATORY_CARE_PROVIDER_SITE_OTHER): Payer: 59

## 2016-12-07 VITALS — BP 116/84 | HR 64 | Ht 68.0 in | Wt 213.2 lb

## 2016-12-07 DIAGNOSIS — R1013 Epigastric pain: Secondary | ICD-10-CM | POA: Diagnosis not present

## 2016-12-07 DIAGNOSIS — R1084 Generalized abdominal pain: Secondary | ICD-10-CM | POA: Diagnosis not present

## 2016-12-07 DIAGNOSIS — R112 Nausea with vomiting, unspecified: Secondary | ICD-10-CM

## 2016-12-07 DIAGNOSIS — R142 Eructation: Secondary | ICD-10-CM | POA: Diagnosis not present

## 2016-12-07 LAB — LIPASE: LIPASE: 30 U/L (ref 11.0–59.0)

## 2016-12-07 LAB — COMPREHENSIVE METABOLIC PANEL
ALBUMIN: 4.2 g/dL (ref 3.5–5.2)
ALK PHOS: 64 U/L (ref 39–117)
ALT: 27 U/L (ref 0–53)
AST: 15 U/L (ref 0–37)
BILIRUBIN TOTAL: 0.3 mg/dL (ref 0.2–1.2)
BUN: 18 mg/dL (ref 6–23)
CO2: 29 mEq/L (ref 19–32)
Calcium: 10.9 mg/dL — ABNORMAL HIGH (ref 8.4–10.5)
Chloride: 106 mEq/L (ref 96–112)
Creatinine, Ser: 1.06 mg/dL (ref 0.40–1.50)
GFR: 81.18 mL/min (ref >60.00)
GLUCOSE: 93 mg/dL (ref 70–99)
POTASSIUM: 4.4 meq/L (ref 3.5–5.1)
SODIUM: 140 meq/L (ref 135–145)
TOTAL PROTEIN: 6.7 g/dL (ref 6.0–8.3)

## 2016-12-07 LAB — CBC WITH DIFFERENTIAL/PLATELET
BASOS ABS: 0.1 10*3/uL (ref 0.0–0.1)
Basophils Relative: 0.8 % (ref 0.0–3.0)
EOS PCT: 3.7 % (ref 0.0–5.0)
Eosinophils Absolute: 0.4 10*3/uL (ref 0.0–0.7)
HCT: 44.9 % (ref 39.0–52.0)
HEMOGLOBIN: 15 g/dL (ref 13.0–17.0)
Lymphocytes Relative: 23.1 % (ref 12.0–46.0)
Lymphs Abs: 2.3 10*3/uL (ref 0.7–4.0)
MCHC: 33.4 g/dL (ref 30.0–36.0)
MCV: 93.4 fl (ref 78.0–100.0)
MONO ABS: 0.5 10*3/uL (ref 0.1–1.0)
MONOS PCT: 5.4 % (ref 3.0–12.0)
Neutro Abs: 6.6 10*3/uL (ref 1.4–7.7)
Neutrophils Relative %: 67 % (ref 43.0–77.0)
Platelets: 259 10*3/uL (ref 150.0–400.0)
RBC: 4.81 Mil/uL (ref 4.22–5.81)
RDW: 13.1 % (ref 11.5–15.5)
WBC: 9.9 10*3/uL (ref 4.0–10.5)

## 2016-12-07 LAB — HIGH SENSITIVITY CRP: CRP, High Sensitivity: 3.57 mg/L (ref 0.000–5.000)

## 2016-12-07 NOTE — Patient Instructions (Addendum)
Please go to the basement level to have your labs drawn.  You have been scheduled for a CT scan of the abdomen and pelvis at [place] ([address]---this is in the same building as Press [facility]).   You are scheduled on Friday 11-9 at 4:00 PM You should arrive at 3:45 PM  to your appointment time for registration. Please follow the written instructions below on the day of your exam:  WARNING: IF YOU ARE ALLERGIC TO IODINE/X-RAY DYE, PLEASE NOTIFY RADIOLOGY IMMEDIATELY AT [phone]! YOU WILL BE GIVEN A 13 HOUR PREMEDICATION PREP.  1) Do not eat anything after  (4 hours prior to your test) 2) You have been given 2 bottles of oral contrast to drink. The solution may taste better if refrigerated, but do NOT add ice or any other liquid to this solution. Shake well before drinking.    Drink 1 bottle of contrast @ 2:00 PM (2 hours prior to your exam)  Drink 1 bottle of contrast @ 3:00 PM (1 hour prior to your exam)  You may take any medications as prescribed with a small amount of water except for the following: Metformin, Glucophage, Glucovance, Avandamet, Riomet, Fortamet, Actoplus Met, Janumet, Glumetza or Metaglip. The above medications must be held the day of the exam AND 48 hours after the exam.  The purpose of you drinking the oral contrast is to aid in the visualization of your intestinal tract. The contrast solution may cause some diarrhea. Before your exam is started, you will be given a small amount of fluid to drink. Depending on your individual set of symptoms, you may also receive an intravenous injection of x-ray contrast/dye. Plan on being at [facility] for 30 minutes or longer, depending on the type of exam you are having performed.  This test typically takes 30-45 minutes to complete.  If you have any questions regarding your exam or if you need to reschedule, you may call the CT department at [phone] between the hours of 8:00 am and 5:00 pm,  Monday-Friday.  ________________________________________________________________________     If you are age 41 or younger, your body mass index should be between 19-25. Your Body mass index is 32.42 kg/m. If this is out of the aformentioned range listed, please consider follow up with your Primary Care Provider.    You have been scheduled for an endoscopy. Please follow written instructions given to you at your visit today. If you use inhalers (even only as needed), please bring them with you on the day of your procedure.

## 2016-12-07 NOTE — Progress Notes (Signed)
Subjective:    Patient ID: Ritta SlotChristopher Todd Well, male    DOB: 12/30/1975, 41 y.o.   MRN: [id number]  HPI Cristal DeerChristopher " Tawanna Coolerodd" is a 41 year old white male, new to GI today referred by Dr. Evelena PeatBruce Burchette for evaluation of abdominal pain belching and burping. Patient has no prior GI history. He does have history of bipolar disorder and substance abuse with cannabis and cocaine, he says he has not used cocaine for a long time. His current symptoms have been present for about a year. He complains of upper abdominal discomfort which comes and goes but is present on an almost daily basis. This is been associated with nausea and sometimes vomiting. He has tried over-the-counter antacids and acid blockers without any benefit. He also took a course of prescription Protonix for 2 months without any change in symptoms. He has not had any significant weight loss. He does complain of fullness in his chest and reflux type symptoms no dysphagia He also describes some cramping and sharp pains in his mid abdomen as well as bloating. His bowel movements have been normal no melena or hematochezia. Does not feel that his symptoms are improved or aggravated by by mouth intake. He denies any regular aspirin or NSAID use take ibuprofen on occasion. He does drink alcohol fairly regularly 1-4 beers per day and more on the weekends.   No recent imaging or labs  Review of Systems Pertinent positive and negative review of systems were noted in the above HPI section.  All other review of systems was otherwise negative.  Outpatient Encounter Prescriptions as of 12/07/2016  Medication Sig  . Ca Carbonate-Mag Hydroxide (ROLAIDS) 550-110 MG CHEW Chew by mouth as needed.  . calcium carbonate (TUMS - DOSED IN MG ELEMENTAL CALCIUM) 500 MG chewable tablet Chew 1 tablet by mouth daily.  . diclofenac sodium (VOLTAREN) 1 % GEL Apply 2 g topically 4 (four) times daily.  Marland Kitchen. omeprazole (PRILOSEC) 20 MG capsule Take 20 mg by mouth daily.  .  ranitidine (ZANTAC) 150 MG tablet Take 150 mg by mouth 2 (two) times daily.   No facility-administered encounter medications on file as of 12/07/2016.    Allergies  Allergen Reactions  . Doxycycline Rash   Patient Active Problem List   Diagnosis Date Noted  . Substance induced mood disorder (HCC) 01/04/2013  . Cocaine abuse with intoxication and without complication (HCC) 01/04/2013  . Cannabis dependence with intoxication (HCC) 01/04/2013  . Bipolar disorder, unspecified (HCC) 01/04/2013   Social History   Social History  . Marital status: Single    Spouse name: N/A  . Number of children: N/A  . Years of education: N/A   Occupational History  . Holiday representativeConstruction Work    Social History Main Topics  . Smoking status: Heavy Tobacco Smoker    Packs/day: 2.00    Years: 15.00    Types: Cigarettes  . Smokeless tobacco: Current User  . Alcohol use 1.8 oz/week    3 Cans of beer per week     Comment: everyday  . Drug use: Yes    Types: Marijuana, Cocaine  . Sexual activity: Yes    Birth control/ protection: Condom   Other Topics Concern  . Not on file   Social History Narrative  . No narrative on file    Mr. Larena GlassmanCecil's family history includes Alcohol abuse in his father and mother; Arthritis in his father and maternal grandmother; Cancer in his maternal aunt, maternal grandfather, and maternal grandmother; Diabetes in his mother;  Lung cancer in his father.      Objective:    Vitals:   12/07/16 0836  BP: 116/84  Pulse: 64    Physical Exam well-developed white male in no acute distress, blood pressure 116/84 pulse 64, height 5 foot 8, weight 213, BMI 32.4. HEENT; nontraumatic normocephalic EOMI PERRLA sclera anicteric, Cardiovascular ;regular rate and rhythm with S1-S2 no murmur or gallop, Pulmonary; clear bilaterally, Abdomen; soft, is tender in the epigastrium and hypogastrium there is no guarding or rebound no palpable mass or hepatosplenomegaly bowel sounds are present,  Rectal ;exam not done, Extremities; no clubbing cyanosis or edema skin warm and dry, Neuropsych; mood and affect appropriate patient was somewhat rapid speech       Assessment & Plan:   #24 41 year old white male with 1 year history of epigastric discomfort belching and burping intermittent bloating nausea occasional vomiting and mid abdominal cramping. Etiology of symptoms is not clear, he has not been responsive to PPI therapy thus far. Rule out peptic ulcer disease, H. pylori induced chronic gastropathy, gallbladder disease, IBS, also consider pancreatic disease with chronic EtOH use. #2 bipolar disorder  Plan; CBC with differential, CMET,, lipase and sedimentation rate Patient will be scheduled for upper endoscopy with Dr. Christella Hartigan. Procedure was discussed in detail with patient including risks and benefits and he is agreeable to proceed. He should be biopsied for H. Pylori We'll also schedule for CT of the abdomen and pelvis with contrast. Further plans pending results of above.  Gizzelle Lacomb S Lanijah Warzecha PA-C 12/07/2016   Cc: Kristian Covey, MD

## 2016-12-07 NOTE — Progress Notes (Signed)
I agree with the above note, plan 

## 2016-12-23 ENCOUNTER — Encounter: Payer: Self-pay | Admitting: Gastroenterology

## 2016-12-24 ENCOUNTER — Ambulatory Visit (INDEPENDENT_AMBULATORY_CARE_PROVIDER_SITE_OTHER)
Admission: RE | Admit: 2016-12-24 | Discharge: 2016-12-24 | Disposition: A | Discharge status: Home / Self Care | Payer: 59 | Source: Ambulatory Visit | Attending: Physician Assistant | Admitting: Physician Assistant

## 2016-12-24 DIAGNOSIS — R1084 Generalized abdominal pain: Secondary | ICD-10-CM

## 2016-12-24 DIAGNOSIS — R142 Eructation: Secondary | ICD-10-CM

## 2016-12-24 DIAGNOSIS — R1013 Epigastric pain: Secondary | ICD-10-CM

## 2016-12-24 DIAGNOSIS — R112 Nausea with vomiting, unspecified: Secondary | ICD-10-CM

## 2016-12-24 MED ORDER — IOPAMIDOL (ISOVUE-300) INJECTION 61%
100.0000 mL | Freq: Once | INTRAVENOUS | Status: AC | PRN
  Administered 2016-12-24: 100 mL via INTRAVENOUS

## 2016-12-31 ENCOUNTER — Telehealth: Payer: Self-pay | Admitting: Gastroenterology

## 2016-12-31 ENCOUNTER — Encounter: Payer: Self-pay | Admitting: Gastroenterology

## 2016-12-31 NOTE — Telephone Encounter (Signed)
Ok, no charge  

## 2017-02-02 ENCOUNTER — Observation Stay
Admission: EM | Admit: 2017-02-02 | Discharge: 2017-02-03 | Disposition: A | Discharge status: Home / Self Care | Payer: PRIVATE HEALTH INSURANCE | Source: Ambulatory Visit | Attending: Internal Medicine | Admitting: Internal Medicine

## 2017-02-02 ENCOUNTER — Emergency Department: Payer: PRIVATE HEALTH INSURANCE

## 2017-02-02 ENCOUNTER — Encounter: Payer: Self-pay | Admitting: Emergency Medicine

## 2017-02-02 DIAGNOSIS — K8500 Idiopathic acute pancreatitis without necrosis or infection: Secondary | ICD-10-CM

## 2017-02-02 DIAGNOSIS — F17200 Nicotine dependence, unspecified, uncomplicated: Secondary | ICD-10-CM | POA: Insufficient documentation

## 2017-02-02 DIAGNOSIS — R1033 Periumbilical pain: Secondary | ICD-10-CM

## 2017-02-02 DIAGNOSIS — K219 Gastro-esophageal reflux disease without esophagitis: Secondary | ICD-10-CM

## 2017-02-02 DIAGNOSIS — F3170 Bipolar disorder, currently in remission, most recent episode unspecified: Secondary | ICD-10-CM | POA: Insufficient documentation

## 2017-02-02 DIAGNOSIS — K859 Acute pancreatitis without necrosis or infection, unspecified: Principal | ICD-10-CM | POA: Insufficient documentation

## 2017-02-02 DIAGNOSIS — E785 Hyperlipidemia, unspecified: Secondary | ICD-10-CM | POA: Insufficient documentation

## 2017-02-02 DIAGNOSIS — R1110 Vomiting, unspecified: Secondary | ICD-10-CM

## 2017-02-02 DIAGNOSIS — K210 Gastro-esophageal reflux disease with esophagitis: Secondary | ICD-10-CM | POA: Insufficient documentation

## 2017-02-02 DIAGNOSIS — R1011 Right upper quadrant pain: Secondary | ICD-10-CM

## 2017-02-02 HISTORY — DX: Calculus of kidney: N20.0

## 2017-02-02 LAB — BASIC METABOLIC PANEL
Anion Gap: 15 (ref 7–16)
CO2: 22 mmol/L (ref 20–28)
Calcium: 9.7 mg/dL (ref 9.0–10.3)
Chloride: 104 mmol/L (ref 96–108)
Creatinine: 0.87 mg/dL (ref 0.67–1.17)
GFR,Black: 122 *
GFR,Caucasian: 106 *
Glucose: 76 mg/dL (ref 60–99)
Lab: 15 mg/dL (ref 6–20)
Potassium: 4 mmol/L (ref 3.3–5.1)
Sodium: 141 mmol/L (ref 133–145)

## 2017-02-02 LAB — CBC AND DIFFERENTIAL
Baso # K/uL: 0.1 10*3/uL (ref 0.0–0.1)
Basophil %: 1.4 %
Eos # K/uL: 0.3 10*3/uL (ref 0.0–0.5)
Eosinophil %: 3.6 %
Hematocrit: 46 % (ref 40–51)
Hemoglobin: 15.9 g/dL (ref 13.7–17.5)
IMM Granulocytes #: 0 10*3/uL (ref 0.0–0.1)
IMM Granulocytes: 0.1 %
Lymph # K/uL: 2.8 10*3/uL (ref 1.3–3.6)
Lymphocyte %: 39.3 %
MCH: 31 pg (ref 26–32)
MCHC: 35 g/dL (ref 32–37)
MCV: 90 fL (ref 79–92)
Mono # K/uL: 0.5 10*3/uL (ref 0.3–0.8)
Monocyte %: 7.1 %
Neut # K/uL: 3.5 10*3/uL (ref 1.8–5.4)
Nucl RBC # K/uL: 0 10*3/uL (ref 0.0–0.0)
Nucl RBC %: 0 /100 WBC (ref 0.0–0.2)
Platelets: 261 10*3/uL (ref 150–330)
RBC: 5.1 MIL/uL (ref 4.6–6.1)
RDW: 12.4 % (ref 11.6–14.4)
Seg Neut %: 48.5 %
WBC: 7.2 10*3/uL (ref 4.2–9.1)

## 2017-02-02 LAB — POCT URINALYSIS DIPSTICK
Blood,UA POCT: NEGATIVE
Glucose,UA POCT: NORMAL mg/dL
Leuk Esterase,UA POCT: NEGATIVE
Lot #: 33502103
Nitrite,UA POCT: NEGATIVE
PH,UA POCT: 5 (ref 5–8)

## 2017-02-02 LAB — LIPID PANEL
Chol/HDL Ratio: 7.8 — ABNORMAL HIGH (ref 0.0–5.0)
Cholesterol: 250 mg/dL — AB
HDL: 32 mg/dL
LDL Calculated: 187 mg/dL — AB
Non HDL Cholesterol: 218 mg/dL
Triglycerides: 156 mg/dL — AB

## 2017-02-02 LAB — RUQ PANEL (ED ONLY)
ALT: 39 U/L (ref 0–50)
AST: 26 U/L (ref 0–50)
Albumin: 4.8 g/dL (ref 3.5–5.2)
Alk Phos: 85 U/L (ref 40–130)
Amylase: 79 U/L (ref 28–100)
Bilirubin,Direct: <0.2 mg/dL (ref 0.0–0.3)
Bilirubin,Total: 0.7 mg/dL (ref 0.0–1.2)
Globulin: 2.7 g/dL (ref 2.7–4.3)
Lipase: 213 U/L — ABNORMAL HIGH (ref 13–60)
Total Protein: 7.5 g/dL (ref 6.3–7.7)

## 2017-02-02 MED ORDER — SODIUM CHLORIDE 0.9 % FLUSH FOR PUMPS *I*
0.0000 mL/h | INTRAVENOUS | Status: DC | PRN
Start: 2017-02-02 — End: 2017-02-03

## 2017-02-02 MED ORDER — SODIUM CHLORIDE 0.9 % IV BOLUS *I*
1000.0000 mL | Freq: Once | Status: AC
Start: 2017-02-02 — End: 2017-02-02
  Administered 2017-02-02: 1000 mL via INTRAVENOUS

## 2017-02-02 MED ORDER — ONDANSETRON HCL 2 MG/ML IV SOLN *I*
4.0000 mg | Freq: Once | INTRAMUSCULAR | Status: AC
Start: 2017-02-02 — End: 2017-02-02
  Administered 2017-02-02: 4 mg via INTRAVENOUS
  Filled 2017-02-02: qty 2

## 2017-02-02 MED ORDER — ENOXAPARIN SODIUM 40 MG/0.4ML IJ SOSY *I*
40.0000 mg | PREFILLED_SYRINGE | Freq: Every day | INTRAMUSCULAR | Status: DC
Start: 2017-02-03 — End: 2017-02-03
  Administered 2017-02-03: 40 mg via SUBCUTANEOUS
  Filled 2017-02-02: qty 0.4

## 2017-02-02 MED ORDER — NICOTINE 21 MG/24HR TD PT24 *I*
1.0000 | MEDICATED_PATCH | Freq: Every day | TRANSDERMAL | Status: DC
Start: 2017-02-02 — End: 2017-02-03
  Administered 2017-02-03: 1 via TRANSDERMAL
  Filled 2017-02-02 (×2): qty 1

## 2017-02-02 MED ORDER — MORPHINE SULFATE 4 MG/ML IV SOLN *WRAPPED*
4.0000 mg | Freq: Once | INTRAVENOUS | Status: AC
Start: 2017-02-02 — End: 2017-02-02
  Administered 2017-02-02: 4 mg via INTRAVENOUS
  Filled 2017-02-02: qty 1

## 2017-02-02 MED ORDER — LACTATED RINGERS IV SOLN *I*
200.0000 mL/h | INTRAVENOUS | Status: DC
Start: 2017-02-02 — End: 2017-02-03
  Administered 2017-02-02: 200 mL/h via INTRAVENOUS
  Administered 2017-02-03 (×9): 200 mL/h
  Administered 2017-02-03: 20 mL/h
  Administered 2017-02-03 (×2): 200 mL/h
  Administered 2017-02-03 (×3): 200 mL/h via INTRAVENOUS
  Administered 2017-02-03: 200 mL/h
  Administered 2017-02-03: 20 mL/h
  Administered 2017-02-03 (×3): 200 mL/h
  Administered 2017-02-03: 200 mL/h via INTRAVENOUS
  Administered 2017-02-03 (×10): 200 mL/h

## 2017-02-02 MED ORDER — SODIUM CHLORIDE 0.9 % IV SOLN WRAPPED *I*
125.0000 mL/h | Status: DC
Start: 2017-02-02 — End: 2017-02-02
  Administered 2017-02-02: 125 mL/h via INTRAVENOUS

## 2017-02-02 MED ORDER — ONDANSETRON HCL 2 MG/ML IV SOLN *I*
4.0000 mg | Freq: Three times a day (TID) | INTRAMUSCULAR | Status: DC | PRN
Start: 2017-02-03 — End: 2017-02-03
  Administered 2017-02-03 (×2): 4 mg via INTRAVENOUS
  Filled 2017-02-02 (×2): qty 2

## 2017-02-02 MED ORDER — HYDROMORPHONE HCL PF 0.5 MG/0.5 ML IJ SOLN *I*
0.5000 mg | INTRAMUSCULAR | Status: DC | PRN
Start: 2017-02-02 — End: 2017-02-03
  Administered 2017-02-02: 0.5 mg via INTRAVENOUS
  Filled 2017-02-02: qty 0.5

## 2017-02-02 MED ORDER — HYDROMORPHONE HCL PF 1 MG/ML IJ SOLN *WRAPPED*
1.0000 mg | Freq: Once | INTRAMUSCULAR | Status: AC
Start: 2017-02-02 — End: 2017-02-02
  Administered 2017-02-02: 1 mg via INTRAVENOUS
  Filled 2017-02-02: qty 1

## 2017-02-02 MED ORDER — DEXTROSE 5 % FLUSH FOR PUMPS *I*
0.0000 mL/h | INTRAVENOUS | Status: DC | PRN
Start: 2017-02-02 — End: 2017-02-03
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
## 2017-02-02 NOTE — ED Provider Progress Notes (Signed)
ED Provider Progress Note    Patient signed out to me by Dr. Noah CharonBansal; I have reevaluated the patient and he is hemodynamically stable.  He has epigastric and right upper quadrant tenderness to palpation.  His labs are significant for an elevated lipase (3 x the upper limit of normal).  Right upper quadrant ultrasound shows no evidence of cholelithiasis nor acute cholecystitis.  Given his significant pain and nausea, will admit to Medicine for further evaluation and care.  Patient understands and agrees with this plan.        Vicente Maleshristine Marie Derricka Mertz, DO, 02/02/2017, 9:25 PM     Vicente MalesJohnstone, Helem Reesor Marie, DO  02/02/17 2127

## 2017-02-02 NOTE — ED Notes (Signed)
Pt to US.

## 2017-02-02 NOTE — ED Notes (Signed)
Report Given To   Toniann FailWendy, RN      Descriptive Sentence / Reason for Admission     Abdominal Pain    Pancreatitis      Active Issues / Relevant Events     Last Filed Vitals    02/02/17 1932   BP: 126/64   Pulse: (!) 16   Resp: 16   Temp: 36.4 C (97.5 F)   SpO2: 97%     HYDROmorphone PF (DILAUDID) injection 1 mg       Date Action Dose Route User    02/02/2017 2015 Given 1 mg Intravenous Dion BodyShea, Aiden Helzer J, RN          Lactated Ringers Infusion       Date Action Dose Route User    02/02/2017 2315 New Bag 200 mL/hr Intravenous Vinnie Langtonunn, Jamie Marie, RN          morphine 4 MG/ML injection 4 mg       Date Action Dose Route User    02/02/2017 1817 Given 4 mg Intravenous Leonia ReevesPessin, Shannon, RN          ondansetron [facility](ZOFRAN) injection 4 mg       Date Action Dose Route User    02/02/2017 1817 Given 4 mg Intravenous Leonia ReevesPessin, Shannon, RN          sodium chloride 0.9 % bolus 1,000 mL       Date Action Dose Route User    02/02/2017 1817 New Bag 1000 mL Intravenous Leonia ReevesPessin, Shannon, RN          sodium chloride 0.9% IV       Date Action Dose Route User    02/02/2017 2015 New Bag 125 mL/hr Intravenous Dion BodyShea, Antoine Vandermeulen J, RN                To Do List    Admission orders  AOX3  Independent  Pain control  NPO      Anticipatory Guidance / Discharge Planning
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
## 2017-02-02 NOTE — ED Triage Notes (Signed)
Pt c/o RUQ abd pain this morning, came from urgent care r/o cholecystitis, h/o kidney stones.       Triage Note   Luster LandsbergMichael A Casten Floren, RN

## 2017-02-02 NOTE — ED Provider Notes (Signed)
History     Chief Complaint   Patient presents with    Abdominal Pain     CC:  Abd pain      HPI:    41 year old male with no significant past medical history presents to emergency department for right upper quadrant abdominal pain.  He states that the pain began around 10 AM today.  It was originally intermittent, but then became constant and has progressively gotten worse.  He has vomited 3 times.  Her normal bowel movement this morning after having coffee.  Denies any urinary urgency or frequency.  No dysuria.  He has had a kidney stone once in the past, and this pain feels nothing like that.  He went to urgent care where he was referred to the emergency department for suspected biliary disease.  He does drink alcohol, but not daily.        History provided by:  Patient      Medical/Surgical/Family History     Past Medical History:   Diagnosis Date    Kidney stone         There is no problem list on file for this patient.           History reviewed. No pertinent surgical history.  No family history on file.       Social History   Substance Use Topics    Smoking status: Not on file    Smokeless tobacco: Not on file    Alcohol use Yes     Living Situation     Questions Responses    Patient lives with     Homeless     Caregiver for other family member     External Services     Employment Employed    Domestic Violence Risk                 Review of Systems   Review of Systems   Constitutional: Negative for chills and fever.   HENT: Negative for sore throat.    Respiratory: Negative for shortness of breath.    Cardiovascular: Negative for chest pain.   Gastrointestinal: Positive for abdominal pain and vomiting. Negative for diarrhea.   Genitourinary: Negative for dysuria.   Skin: Negative for rash.   Neurological: Negative for dizziness.   All other systems reviewed and are negative.      Physical Exam     Triage Vitals  Triage Start: Start, (02/02/17 1510)   First Recorded BP: 134/86, Resp: 18, Temp: 36 C  (96.8 F) Oxygen Therapy SpO2: 96 %, Heart Rate: 78, (02/02/17 1511)  .  First Pain Reported  0-10 Scale: 5, Pain Location/Orientation: Abdomen, (02/02/17 1511)       Physical Exam   Constitutional: He is oriented to person, place, and time. He appears well-developed. No distress.   HENT:   Head: Atraumatic.   Nose: Nose normal.   Eyes: EOM are normal.   Neck: Neck supple.   Cardiovascular: Normal rate and regular rhythm.    No murmur heard.  Pulmonary/Chest: Effort normal and breath sounds normal. No respiratory distress.   Abdominal: Soft. Bowel sounds are normal. He exhibits no distension. There is tenderness in the right upper quadrant. There is guarding and positive Murphy's sign. There is no rigidity.   Musculoskeletal: Normal range of motion. He exhibits no edema or tenderness.   Neurological: He is alert and oriented to person, place, and time. He has normal strength. No sensory deficit.  Skin: Skin is warm and dry. No rash noted.   Psychiatric: Judgment and thought content normal.   Vitals reviewed.      Medical Decision Making      Amount and/or Complexity of Data Reviewed  Clinical lab tests: ordered and reviewed  Tests in the radiology section of CPT: ordered  Tests in the medicine section of CPT: ordered and reviewed        Initial Evaluation:  ED First Provider Contact     Date/Time Event User Comments    02/02/17 1740 ED First Provider Contact Dezerae Freiberger, [place] Initial Face to Face Provider Contact          Patient seen by me on arrival date of 02/02/2017.    Assessment:  41 y.o.male comes to the ED with Abd pain      Differential Diagnosis includes:  Biliary colic  Cholecystitis  Choledocholithiasis  Pancreatitis  Renal colic less likely    Plan: labs, IVF, analgesia, U/S RUQ    Orders Placed This Encounter   Procedures    US abdomen limited single quad or f/u specify    CBC and differential    RUQ panel (ED only)    Basic metabolic panel    Insert peripheral IV             Lab results:  02/02/17  1812   WBC 7.2   Hemoglobin 15.9   Hematocrit 46   RBC 5.1   Platelets 261   Seg Neut % 48.5   Lymphocyte % 39.3   Monocyte % 7.1   Eosinophil % 3.6   Basophil % 1.4           Lab results: 02/02/17  1812   Sodium 141   Potassium 4.0   Chloride 104   CO2 22   UN 15   Creatinine 0.87   GFR,Caucasian 106   GFR,Black 122   Glucose 76   Calcium 9.7   Total Protein 7.5   Albumin 4.8   ALT 39   AST 26   Alk Phos 85   Bilirubin,Total 0.7         Recent Labs  Lab 02/02/17  1812   Amylase 79   Lipase 213*   Total Protein 7.5   Albumin 4.8   Bilirubin,Total 0.7   Bilirubin,Direct <0.2   Alk Phos 85   AST 26   ALT 39         02/02/2017 8:11 PM:.  Labs reassuring except for mild elevation in lipase.  Differential includes low-grade pancreatitis versus biliary pathology.  He remains in pain.  Ultrasound is still pending.  Signed out to Dr. Kathrin Greathouse.  Pending results of ultrasound, he may need surgical consult versus pain control overnight.  Urine dip ordered as well.                 Rica Koyanagi, MD          Rica Koyanagi, MD  02/02/17 2012

## 2017-02-03 DIAGNOSIS — R1033 Periumbilical pain: Secondary | ICD-10-CM | POA: Diagnosis present

## 2017-02-03 DIAGNOSIS — K219 Gastro-esophageal reflux disease without esophagitis: Secondary | ICD-10-CM | POA: Diagnosis present

## 2017-02-03 LAB — CBC AND DIFFERENTIAL
Baso # K/uL: 0.1 10*3/uL (ref 0.0–0.1)
Basophil %: 0.9 %
Eos # K/uL: 0.4 10*3/uL (ref 0.0–0.5)
Eosinophil %: 5.3 %
Hematocrit: 42 % (ref 40–51)
Hemoglobin: 14.5 g/dL (ref 13.7–17.5)
IMM Granulocytes #: 0 10*3/uL (ref 0.0–0.1)
IMM Granulocytes: 0.2 %
Lymph # K/uL: 2.8 10*3/uL (ref 1.3–3.6)
Lymphocyte %: 42.9 %
MCH: 31 pg (ref 26–32)
MCHC: 34 g/dL (ref 32–37)
MCV: 91 fL (ref 79–92)
Mono # K/uL: 0.5 10*3/uL (ref 0.3–0.8)
Monocyte %: 7.7 %
Neut # K/uL: 2.8 10*3/uL (ref 1.8–5.4)
Nucl RBC # K/uL: 0 10*3/uL (ref 0.0–0.0)
Nucl RBC %: 0 /100 WBC (ref 0.0–0.2)
Platelets: 234 10*3/uL (ref 150–330)
RBC: 4.7 MIL/uL (ref 4.6–6.1)
RDW: 12.6 % (ref 11.6–14.4)
Seg Neut %: 43 %
WBC: 6.6 10*3/uL (ref 4.2–9.1)

## 2017-02-03 LAB — BASIC METABOLIC PANEL
Anion Gap: 13 (ref 7–16)
CO2: 22 mmol/L (ref 20–28)
Calcium: 8.9 mg/dL — ABNORMAL LOW (ref 9.0–10.3)
Chloride: 105 mmol/L (ref 96–108)
Creatinine: 0.9 mg/dL (ref 0.67–1.17)
GFR,Black: 121 *
GFR,Caucasian: 104 *
Glucose: 72 mg/dL (ref 60–99)
Lab: 14 mg/dL (ref 6–20)
Potassium: 4.1 mmol/L (ref 3.3–5.1)
Sodium: 140 mmol/L (ref 133–145)

## 2017-02-03 MED ORDER — OMEPRAZOLE 20 MG PO CPDR *I*
20.0000 mg | DELAYED_RELEASE_CAPSULE | Freq: Two times a day (BID) | ORAL | 0 refills | Status: AC
Start: 2017-02-03 — End: ?

## 2017-02-03 MED ORDER — PROMETHAZINE HCL 25 MG/ML IJ SOLN *I*
25.0000 mg | Freq: Four times a day (QID) | INTRAMUSCULAR | Status: DC | PRN
Start: 2017-02-03 — End: 2017-02-03
  Administered 2017-02-03: 25 mg via INTRAVENOUS
  Filled 2017-02-03: qty 1

## 2017-02-03 MED ORDER — DIPHENHYDRAMINE HCL 50 MG/ML IJ SOLN *I*
25.0000 mg | Freq: Four times a day (QID) | INTRAMUSCULAR | Status: DC | PRN
Start: 2017-02-03 — End: 2017-02-03

## 2017-02-03 MED ORDER — PANTOPRAZOLE SODIUM 40 MG IV SOLR *I*
40.0000 mg | Freq: Two times a day (BID) | INTRAVENOUS | Status: DC
Start: 2017-02-03 — End: 2017-02-03
  Administered 2017-02-03: 40 mg via INTRAVENOUS
  Filled 2017-02-03: qty 10

## 2017-02-03 MED ORDER — HYDROMORPHONE HCL PF 1 MG/ML IJ SOLN *WRAPPED*
1.0000 mg | INTRAMUSCULAR | Status: DC | PRN
Start: 2017-02-03 — End: 2017-02-03
  Administered 2017-02-03 (×5): 1 mg via INTRAVENOUS
  Filled 2017-02-03 (×5): qty 1

## 2017-02-03 MED ORDER — OXYCODONE HCL 5 MG PO CAPS *A*
5.0000 mg | ORAL_CAPSULE | Freq: Four times a day (QID) | ORAL | 0 refills | Status: AC | PRN
Start: 2017-02-03 — End: ?

## 2017-02-03 MED ORDER — DIPHENHYDRAMINE HCL 25 MG PO TABS *I*
25.0000 mg | ORAL_TABLET | Freq: Once | ORAL | Status: AC
Start: 2017-02-03 — End: 2017-02-03
  Administered 2017-02-03: 25 mg via ORAL
  Filled 2017-02-03: qty 1

## 2017-02-03 NOTE — Progress Notes (Signed)
Pt started itching. No visible rash, just red marks from intense scratching. Notified covering provider Derry Loryyler Batey, benadryl 25 mg po ordered. Will give to the patient and continue to monitor. Vicente Sereneabitha C Lelar Farewell, RN  02/03/2017  4:35 AM

## 2017-02-03 NOTE — Progress Notes (Signed)
Pharmacy Admission Medication Reconciliation    Pharmacy Admission Medication Reconciliation    Medication List Reviewer: Ted McalpineJacob Kuchman - Pharmacy Medication Reconciliation Technician  I have reviewed this patient's medication history by:     Patient Interview      After reviewing the current home medication list, I have identified the following:    1. No changes    Additional Comments:      Ted McalpineJacob Kuchman    The home list is updated and inpatient medications have been reviewed.      Chancy HurterMichelle Magaly Pollina, Pharm.D., BCPS

## 2017-02-03 NOTE — Progress Notes (Signed)
02/03/17 0900   [facility] Patient Class Review   Patient Class Review Inpatient

## 2017-02-03 NOTE — ED Notes (Signed)
Writer spoke with Maurice Mcbride who is writing orders foir pt and explained that pt is in a lot of pain, asking for meds. Order for Dilaudid to be changed to 1mg  every 3 hours, and writer was told pt can have 1 mg Dilaudid now, despite pt received 0.5mg  at 2315.

## 2017-02-03 NOTE — Interdisciplinary Rounds (Signed)
Interdisciplinary Rounds Note    Date: 02/03/2017   Time: 10:29 AM   Attendance:  Care Coordinator, Registered Nurse and Social Worker    Admit Date/Time:  02/02/2017  5:14 PM    Principal Problem: Acute pancreatitis  Problem List:   Patient Active Problem List    Diagnosis Date Noted    Acute pancreatitis 02/02/2017    Bipolar affective disorder in remission 02/02/2017    Nicotine dependence, unspecified, uncomplicated 02/02/2017       The patient's problem list and interdisciplinary care plan was reviewed.    Discharge Planning                 *Does patient currently have home care services?: No     *Current External Services: None                      Plan    Anticipated Discharge Date: TBD     Discharge Disposition: Home     Maurice Mcbride, LMSW  02/03/17  10:29 AM

## 2017-02-03 NOTE — Discharge Instructions (Signed)
Active Hospital Problems    Diagnosis Date Noted    Acute periumbilical pain 02/03/2017    GERD without esophagitis 02/03/2017    Acute pancreatitis 02/02/2017    Nicotine dependence, unspecified, uncomplicated 02/02/2017      Resolved Hospital Problems    Diagnosis Date Noted Date Resolved   No resolved problems to display.       Brief Summary of Your Hospital Course:  You were admitted with abdominal pain..  You were given IV fluids and pain medications and bowel rest.      Your diagnosis:  Acute periumbilical pain    Your instructions:  Take your prilosec as you were taking prior to admission    Any changes in your medications:  See mediciation reconciliation    Recommended diet: low residue diet    Recommended activity: activity as tolerated    Wound Care: none needed      If you experience any of these symptoms after discharge:Pain, Shortness of breath, Fever of 101 F. or greater, Fever greater than 100.5, Poor feeding, Poor urinary output, Vomiting, Nausea, Diarrhea, Blood in stool, Loss of consciousness, New or worsening headache or Weakness please follow up with your PCP:  Unknown, Provider None

## 2017-02-03 NOTE — Progress Notes (Signed)
Report called to W7. VSS pt transferred via w/c

## 2017-02-03 NOTE — ED Notes (Signed)
Report Given To  [facility]      Descriptive Sentence / Reason for Admission   Pt came to ED c/o abdominal pain/pressure, RUQ since yesterday am. Pt reports he was at work when the pain started. Pt went to UC but was sent to ED for further work up. Pt has been having abdominal pain for the past 6-8 months and getting a work up for them.      Active Issues / Relevant Events   IV 200cc/hr LR  Patient can have Dilaudid 1 mg every 3 hours prn. Received dose at 0041  Ambulatory/independent      To Do List  MAR  Labs at 0600      Anticipatory Guidance / Discharge Planning  Admit to hospital for acute pancreatitis

## 2017-02-04 NOTE — Discharge Summary (Signed)
Name: Maurice CostaChristopher Mcbride MRN: [id number] DOB: 03/05/1975     Admit Date: 02/02/2017   Date of Discharge: 02/04/2017     Patient was accepted for discharge to   Home or Self Care [1]           Discharge Attending Physician: Dalbert GarnetYim, Kyong Wook, MD      Hospitalization Summary    CONCISE NARRATIVE: This is a 41 year old male with PMH significant for GERD, nicotine dependence, admitted with acute periumbilical/RUQ pain with mild elevation of lipase.  US as below was negative.  This was consistent with pancreatitis.  He was admitted for NPO, IV fluids.  He was also given IV protonix and IV pain medication.  He was able to tolerate clear liquid diet and due to travel arrangements wanted to be discharged with improvement in his symtoms.  He will follow-up with his PCP in [place].              ULTRASOUND RESULTS: 12/19 ultrasound abdomen - No sonographic findings are seen to explain patient's right upper quadrant pain.               SignedEileen Stanford: Kaho Selle I Ziomara Birenbaum, PA  On: 02/04/2017  at: 8:56 AM

## 2017-02-10 NOTE — Progress Notes (Signed)
Pharmacy Transitional Care Services     Intervention: Writer reviewed patient's medical record and in an effort to prevent readmission within 30 days post-discharge, patient is eligible for pharmacy intervention.      Plan: Writer to review patient's AVS report, counsel patient/caregiver if appropriate, and follow patient post discharge as needed.     Please call me about any discharge medication questions.    Aracelis Ulrey Kossykh-Burrows, PharmD, BCPS.

## 2017-02-11 ENCOUNTER — Telehealth: Payer: Self-pay | Admitting: Gastroenterology

## 2017-02-11 NOTE — Telephone Encounter (Signed)
The pt has been rescheduled for EGD, he already has instructions and signed paperwork.

## 2017-02-18 ENCOUNTER — Ambulatory Visit: Payer: 59 | Admitting: Family Medicine

## 2017-02-18 ENCOUNTER — Encounter: Payer: Self-pay | Admitting: Family Medicine

## 2017-02-18 VITALS — BP 100/78 | HR 62 | Temp 98.0°F | Wt 205.0 lb

## 2017-02-18 DIAGNOSIS — R1013 Epigastric pain: Secondary | ICD-10-CM | POA: Diagnosis not present

## 2017-02-18 DIAGNOSIS — R21 Rash and other nonspecific skin eruption: Secondary | ICD-10-CM | POA: Diagnosis not present

## 2017-02-18 DIAGNOSIS — K859 Acute pancreatitis without necrosis or infection, unspecified: Secondary | ICD-10-CM

## 2017-02-18 LAB — COMPREHENSIVE METABOLIC PANEL
ALBUMIN: 4.3 g/dL (ref 3.5–5.2)
ALT: 24 U/L (ref 0–53)
AST: 13 U/L (ref 0–37)
Alkaline Phosphatase: 67 U/L (ref 39–117)
BUN: 15 mg/dL (ref 6–23)
CALCIUM: 9.5 mg/dL (ref 8.4–10.5)
CO2: 27 mEq/L (ref 19–32)
Chloride: 107 mEq/L (ref 96–112)
Creatinine, Ser: 1 mg/dL (ref 0.40–1.50)
GFR: 86.75 mL/min (ref >60.00)
Glucose, Bld: 84 mg/dL (ref 70–99)
POTASSIUM: 4.4 meq/L (ref 3.5–5.1)
Sodium: 140 mEq/L (ref 135–145)
TOTAL PROTEIN: 6.6 g/dL (ref 6.0–8.3)
Total Bilirubin: 0.3 mg/dL (ref 0.2–1.2)

## 2017-02-18 LAB — LIPASE: LIPASE: 171 U/L — AB (ref 11.0–59.0)

## 2017-02-18 LAB — CBC WITH DIFFERENTIAL/PLATELET
Basophils Absolute: 0.1 10*3/uL (ref 0.0–0.1)
Basophils Relative: 1.2 % (ref 0.0–3.0)
EOS PCT: 2.7 % (ref 0.0–5.0)
Eosinophils Absolute: 0.2 10*3/uL (ref 0.0–0.7)
HCT: 45.6 % (ref 39.0–52.0)
HEMOGLOBIN: 15.1 g/dL (ref 13.0–17.0)
LYMPHS ABS: 2.1 10*3/uL (ref 0.7–4.0)
Lymphocytes Relative: 31.4 % (ref 12.0–46.0)
MCHC: 33.1 g/dL (ref 30.0–36.0)
MCV: 92.8 fl (ref 78.0–100.0)
MONOS PCT: 5.7 % (ref 3.0–12.0)
Monocytes Absolute: 0.4 10*3/uL (ref 0.1–1.0)
NEUTROS PCT: 59 % (ref 43.0–77.0)
Neutro Abs: 3.9 10*3/uL (ref 1.4–7.7)
Platelets: 269 10*3/uL (ref 150.0–400.0)
RBC: 4.92 Mil/uL (ref 4.22–5.81)
RDW: 13 % (ref 11.5–15.5)
WBC: 6.7 10*3/uL (ref 4.0–10.5)

## 2017-02-18 MED ORDER — NYSTATIN 100000 UNIT/GM EX CREA: 1.0000 "application " | TOPICAL_CREAM | Freq: Two times a day (BID) | CUTANEOUS | 2 refills | Status: DC

## 2017-02-18 NOTE — Patient Instructions (Signed)
Acute Pancreatitis  Acute pancreatitis is a condition in which the pancreas suddenly becomes irritated and swollen (has inflammation). The pancreas is a gland that is located behind the stomach. It produces enzymes that help to digest food. The pancreas also releases the hormones glucagon and insulin, which help to regulate blood sugar. Damage to the pancreas occurs when the digestive enzymes from the pancreas are activated before they are released into the intestine.  Most acute attacks last a couple of days and can cause serious problems. Some people become dehydrated and develop low blood pressure. In severe cases, bleeding into the pancreas can lead to shock and can be life-threatening. The lungs, heart, and kidneys may fail.  What are the causes?  The most common causes of this condition are:  · Alcohol abuse.  · Gallstones.    Other causes include:  · Certain medicines.  · Exposure to certain chemicals.  · Infection.  · Damage caused by an accident (trauma).  · Abdominal surgery.    In some cases, the cause may not be known.  What are the signs or symptoms?  Symptoms of this condition include:  · Pain in the upper abdomen that may radiate to the back.  · Tenderness and swelling of the abdomen.  · Nausea and vomiting.    How is this diagnosed?  This condition may be diagnosed based on:  · A physical exam.  · Blood tests.  · Imaging tests, such as X-rays, CT scans, or an ultrasound of the abdomen.    How is this treated?  Treatment for this condition usually requires a stay in the hospital. Treatment may include:  · Pain medicine.  · Fluid replacement through an IV tube.  · Placing a tube in the stomach to remove stomach contents and to control vomiting (NG tube, or nasogastric tube).  · Not eating for 3-4 days. This gives the pancreas a rest, because enzymes are not being produced that can cause further damage.  · Antibiotic medicines, if your condition is caused by an infection.  · Surgery on the pancreas or  gallbladder.    Follow these instructions at home:  Eating and drinking  · Follow instructions from your health care provider about diet. This may involve avoiding alcohol and decreasing the amount of fat in your diet.  · Eat smaller, more frequent meals. This reduces the amount of digestive fluids that the pancreas produces.  · Drink enough fluid to keep your urine clear or pale yellow.  · Do not drink alcohol if it caused your condition.  General instructions  · Take over-the-counter and prescription medicines only as told by your health care provider.  · Do not use any tobacco products, such as cigarettes, chewing tobacco, and e-cigarettes. If you need help quitting, ask your health care provider.  · Get plenty of rest.  · If directed, check your blood sugar at home as told by your health care provider.  · Keep all follow-up visits as told by your health care provider. This is important.  Contact a health care provider if:  · You do not recover as quickly as expected.  · You develop new or worsening symptoms.  · You have persistent pain, weakness, or nausea.  · You recover and then have another episode of pain.  · You have a fever.  Get help right away if:  · You cannot eat or keep fluids down.  · Your pain becomes severe.  · Your skin or the   white part of your eyes turns yellow (jaundice).  · You vomit.  · You feel dizzy or you faint.  · Your blood sugar is high (over 300 mg/dL).  This information is not intended to replace advice given to you by your health care provider. Make sure you discuss any questions you have with your health care provider.  Document Released: 02/01/2005 Document Revised: 06/11/2015 Document Reviewed: 11/05/2014  [employer]vier Interactive Patient Education © 2018 [employer]

## 2017-02-18 NOTE — Progress Notes (Signed)
Subjective:     Patient ID: Eliud Polo, male   DOB: [birth year], 42 y.o.   MRN: [id number]  HPI Patient is here for several issues as follows.  Recent acute pancreatitis diagnosed when he was up in [place]. He had presented here back in October with some epigastric abdominal pain.  He been taking proton pump inhibitor without much improvement. We referred to GI. He had CT abdomen and pelvis 11/18 which was unremarkable. His lab work including lipase was normal at that time. He was in process of being set up for EGD.  He was admitted to hospital in [place] December 19 with worsening epigastric pain. His lipase was elevated at 213 with amylase 79. Other labs were unremarkable including liver transaminases and CBC. Alkaline phosphatase normal. Patient was treated with IV fluids and NPO and discharged on regular diet.  Patient had ultrasound abdomen 02/02/17 which showed no evidence for gallstones. Lipid panel revealed triglycerides minimally elevated 156. Patient does drink beer but had previously stated he usually drinks 2-6 beers 2 or 3 times per week. Less since that time. He has had some persistent nausea and even occasional vomiting since discharge from hospital. He states his appetite is down. His weight is only down 2 pounds from when he was here in October though. He has follow-up next week with GI for EGD  Still complains of some right upper quadrant and epigastric pains. No clear provoking factors. No alleviating factors.  Patient has had recurrent skin abscesses. He has a different rash today which is bilateral groin rash. Nonpruritic and asymptomatic. Present for several weeks. No history of diabetes. He tried some type of over-the-counter which he cannot recall the name of which did not work.  He has been out of work since late December and is requesting FMLA papers be completed.  His job requires frequent travel and he is concerned he may have recurrent flare (of  pancreatitis) while traveling.  Past Medical History:  Diagnosis Date  . Arthritis   . Depression   . GERD (gastroesophageal reflux disease)   . Kidney stone    Past Surgical History:  Procedure Laterality Date  . KIDNEY STONE REMOVAL    . KIDNEY STONE SURGERY Right 04/20/1994    reports that he has been smoking cigarettes.  He has a 30.00 pack-year smoking history. He uses smokeless tobacco. He reports that he drinks about 1.8 oz of alcohol per week. He reports that he uses drugs. Drugs: Marijuana and Cocaine. family history includes Alcohol abuse in his father and mother; Arthritis in his father and maternal grandmother; Cancer in his maternal aunt, maternal grandfather, and maternal grandmother; Diabetes in his mother; Lung cancer in his father. Allergies  Allergen Reactions  . Doxycycline Rash       Review of Systems  Constitutional: Positive for appetite change and fatigue. Negative for chills and fever.  HENT: Negative for trouble swallowing.   Respiratory: Negative for cough and shortness of breath.   Cardiovascular: Negative for chest pain.  Gastrointestinal: Positive for abdominal pain and nausea. Negative for blood in stool and diarrhea.  Genitourinary: Negative for dysuria.  Skin: Positive for rash.  Neurological: Negative for weakness.  Hematological: Negative for adenopathy. Does not bruise/bleed easily.       Objective:   Physical Exam  Constitutional: He appears well-developed and well-nourished.  HENT:  Mouth/Throat: Oropharynx is clear and moist.  Neck: Neck supple. No thyromegaly present.  Cardiovascular: Normal rate and regular rhythm.  Pulmonary/Chest: Effort normal and breath sounds normal. No respiratory distress. He has no wheezes. He has no rales.  Abdominal: Soft. Bowel sounds are normal. He exhibits no distension and no mass. There is no rebound and no guarding.  Minimal tenderness epigastric and RUQ.  Lymphadenopathy:    He has no cervical  adenopathy.  Skin: Rash noted.  Pt has macular erythematous rash groin region bilaterally. Does not fluoresce with Wood's lamp       Assessment:     # 1 several month history of persistent epigastric and right upper quadrant abdominal pain with recent unremarkable abdominal ultrasound and CT abdomen pelvis with pending EGD per GI Recent diagnosis of acute pancreatitis but not clear this is causing all of his symptoms as he had normal lipase even while having epigastric pains prior to recent admission  #2 recent acute pancreatitis-no evidence for gallstones and triglycerides only minimally elevated. Question related alcohol  #3 groin rash. No evidence for erythrasma. More likely fungal.  #4 mild hypertriglyceridemia.  #5 hx of substance abuse.      Plan:     -Continue follow-up with GI as scheduled -Follow-up labs today with CBC, comprehensive metabolic panel, lipase -He is encouraged to avoid all alcohol at this time -Nystatin cream to groin region twice daily -Patient requesting dermatology referral and we will go ahead and set up -Patient requesting FMLA papers. He was been out of work since December 22. He is very reluctant to travel or fly out of town this point with his recent acute pancreatitis episode and ongoing symptoms -over 40 minutes were spent with pt -reviewing recent hospitalizations, reviewing recent labs/x-rays, discussing potential triggers/etiologies for pancreatitis, discussing potential complications of pancreatitis, getting details for completing FMLA papers.  Kristian CoveyBruce W Burchette MD [facility]

## 2017-02-20 ENCOUNTER — Encounter: Payer: Self-pay | Admitting: Family Medicine

## 2017-02-20 DIAGNOSIS — K859 Acute pancreatitis without necrosis or infection, unspecified: Secondary | ICD-10-CM | POA: Insufficient documentation

## 2017-02-21 ENCOUNTER — Other Ambulatory Visit: Payer: Self-pay | Admitting: Family Medicine

## 2017-02-21 ENCOUNTER — Encounter: Payer: Self-pay | Admitting: Family Medicine

## 2017-02-21 DIAGNOSIS — R748 Abnormal levels of other serum enzymes: Secondary | ICD-10-CM

## 2017-02-22 ENCOUNTER — Encounter: Payer: Self-pay | Admitting: Gastroenterology

## 2017-02-22 ENCOUNTER — Other Ambulatory Visit: Payer: Self-pay

## 2017-02-22 ENCOUNTER — Ambulatory Visit (AMBULATORY_SURGERY_CENTER): Payer: 59 | Admitting: Gastroenterology

## 2017-02-22 VITALS — BP 105/51 | HR 50 | Temp 96.8°F | Resp 12 | Ht 68.0 in | Wt 205.0 lb

## 2017-02-22 DIAGNOSIS — R1013 Epigastric pain: Secondary | ICD-10-CM | POA: Diagnosis present

## 2017-02-22 DIAGNOSIS — K2990 Gastroduodenitis, unspecified, without bleeding: Secondary | ICD-10-CM

## 2017-02-22 DIAGNOSIS — K2970 Gastritis, unspecified, without bleeding: Secondary | ICD-10-CM

## 2017-02-22 MED ORDER — SODIUM CHLORIDE 0.9 % IV SOLN: 500.0000 mL | Freq: Once | INTRAVENOUS | Status: DC

## 2017-02-22 NOTE — Progress Notes (Signed)
Report to PACU, RN, vss, BBS= Clear.  

## 2017-02-22 NOTE — Op Note (Signed)
[facility] Patient Name: Randy Neal Procedure Date: 02/22/2017 9:24 AM MRN: [id number] Endoscopist: Rachael Fee , MD Age: 42 Referring MD:  Date of Birth: September 01, 1975 Gender: Male Account #: [id number] Procedure:                Upper GI endoscopy Indications:              Epigastric abdominal pain, Dyspepsia Medicines:                Monitored Anesthesia Care Procedure:                Pre-Anesthesia Assessment:                           - Prior to the procedure, a History and Physical                            was performed, and patient medications and                            allergies were reviewed. The patient's tolerance of                            previous anesthesia was also reviewed. The risks                            and benefits of the procedure and the sedation                            options and risks were discussed with the patient.                            All questions were answered, and informed consent                            was obtained. Prior Anticoagulants: The patient has                            taken no previous anticoagulant or antiplatelet                            agents. ASA Grade Assessment: II - A patient with                            mild systemic disease. After reviewing the risks                            and benefits, the patient was deemed in                            satisfactory condition to undergo the procedure.                           After obtaining informed consent, the endoscope was  passed under direct vision. Throughout the                            procedure, the patient's blood pressure, pulse, and                            oxygen saturations were monitored continuously. The                            Model GIF-[id number]) scope was introduced                            through the mouth, and advanced to the second part                            of duodenum. The  upper GI endoscopy was                            accomplished without difficulty. The patient                            tolerated the procedure well. Scope In: Scope Out: Findings:                 Mild (LA Grade A) reflux esophagitis.                           Mild inflammation characterized by erosions,                            erythema and granularity was found in the entire                            examined stomach. Biopsies were taken with a cold                            forceps for histology.                           Mild bulbar duodenitis (single small erosion).                           The exam was otherwise without abnormality. Complications:            No immediate complications. Estimated blood loss:                            None. Estimated Blood Loss:     Estimated blood loss: none. Impression:               - Gastritis. Biopsied to check for H. pylori.                           - Reflux (acid related) mild esophagitis.                           -  Duodenitis (single duodenal bulb erosion)                           - The examination was otherwise normal. Recommendation:           - Patient has a contact number available for                            emergencies. The signs and symptoms of potential                            delayed complications were discussed with the                            patient. Return to normal activities tomorrow.                            Written discharge instructions were provided to the                            patient.                           - Resume previous diet.                           - Continue present medications.                           - Await pathology results. If + for H. pylori, will                            treat with appropriate antibiotics. If negative for                            H. pylori, will start twice daily OTC H2 blockers,                            strict etoh and NSAID avoidance and further  testing                            with MRI abd with MRCP given persistent slightly                            elevated pancreatic enzymes (lipase around 200) and                            chronic pain. Rachael Feeaniel P Myer Bohlman, MD 02/22/2017 9:43:40 AM This report has been signed electronically.

## 2017-02-22 NOTE — Progress Notes (Signed)
Called to room to assist during endoscopic procedure.  Patient ID and intended procedure confirmed with present staff. Received instructions for my participation in the procedure from the performing physician.  

## 2017-02-22 NOTE — Patient Instructions (Signed)
YOU HAD AN ENDOSCOPIC PROCEDURE TODAY AT THE [facility]:   Refer to the procedure report that was given to you for any specific questions about what was found during the examination.  If the procedure report does not answer your questions, please call your gastroenterologist to clarify.  If you requested that your care partner not be given the details of your procedure findings, then the procedure report has been included in a sealed envelope for you to review at your convenience later.  YOU SHOULD EXPECT: Some feelings of bloating in the abdomen. Passage of more gas than usual.  Walking can help get rid of the air that was put into your GI tract during the procedure and reduce the bloating. If you had a lower endoscopy (such as a colonoscopy or flexible sigmoidoscopy) you may notice spotting of blood in your stool or on the toilet paper. If you underwent a bowel prep for your procedure, you may not have a normal bowel movement for a few days.  Please Note:  You might notice some irritation and congestion in your nose or some drainage.  This is from the oxygen used during your procedure.  There is no need for concern and it should clear up in a day or so.  SYMPTOMS TO REPORT IMMEDIATELY:   Following upper endoscopy (EGD)  Vomiting of blood or coffee ground material  New chest pain or pain under the shoulder blades  Painful or persistently difficult swallowing  New shortness of breath  Fever of 100F or higher  Black, tarry-looking stools  For urgent or emergent issues, a gastroenterologist can be reached at any hour by calling [phone].   DIET:  We do recommend a small meal at first, but then you may proceed to your regular diet.  Drink plenty of fluids but you should avoid alcoholic beverages for 24 hours.  ACTIVITY:  You should plan to take it easy for the rest of today and you should NOT DRIVE or use heavy machinery until tomorrow (because of the sedation medicines used  during the test).    FOLLOW UP: Our staff will call the number listed on your records the next business day following your procedure to check on you and address any questions or concerns that you may have regarding the information given to you following your procedure. If we do not reach you, we will leave a message.  However, if you are feeling well and you are not experiencing any problems, there is no need to return our call.  We will assume that you have returned to your regular daily activities without incident.  If any biopsies were taken you will be contacted by phone or by letter within the next 1-3 weeks.  Please call us at [phone] if you have not heard about the biopsies in 3 weeks.  Await for biopsy results  SIGNATURES/CONFIDENTIALITY: You and/or your care partner have signed paperwork which will be entered into your electronic medical record.  These signatures attest to the fact that that the information above on your After Visit Summary has been reviewed and is understood.  Full responsibility of the confidentiality of this discharge information lies with you and/or your care-partner. 

## 2017-02-23 ENCOUNTER — Telehealth: Payer: Self-pay

## 2017-02-23 ENCOUNTER — Telehealth: Payer: Self-pay | Admitting: Family Medicine

## 2017-02-23 NOTE — Telephone Encounter (Signed)
The patient dropped off another page to his short term disability form.   Call patient to pick up at: [phone]  Disposition: Dr's Folder

## 2017-02-23 NOTE — Telephone Encounter (Signed)
Placed on Dr Burchette's desk 

## 2017-02-23 NOTE — Telephone Encounter (Signed)
  Follow up Call-  Call back number 02/22/2017  Post procedure Call Back phone  # [phone]  Permission to leave phone message Yes  Some recent data might be hidden     Patient questions:  Do you have a fever, pain , or abdominal swelling? No. Pain Score  0 *  Have you tolerated food without any problems? Yes.    Have you been able to return to your normal activities? Yes.    Do you have any questions about your discharge instructions: Diet   No. Medications  No. Follow up visit  No.  Do you have questions or concerns about your Care? No.  Actions: * If pain score is 4 or above: No action needed, pain <4.

## 2017-02-24 ENCOUNTER — Telehealth: Payer: Self-pay | Admitting: Gastroenterology

## 2017-02-24 ENCOUNTER — Encounter: Payer: Self-pay | Admitting: Gastroenterology

## 2017-02-24 NOTE — Telephone Encounter (Signed)
The pt will continue bland diet for now for the abd pain.  Will call him as soon as the path results are reviewed and recommendations are made.

## 2017-02-25 ENCOUNTER — Other Ambulatory Visit: Payer: Self-pay

## 2017-02-25 DIAGNOSIS — K859 Acute pancreatitis without necrosis or infection, unspecified: Secondary | ICD-10-CM

## 2017-02-25 DIAGNOSIS — R109 Unspecified abdominal pain: Secondary | ICD-10-CM

## 2017-02-26 ENCOUNTER — Telehealth: Payer: Self-pay | Admitting: Internal Medicine

## 2017-02-26 NOTE — Telephone Encounter (Signed)
Called just before midnight with N,V. Has been going on one year. Recent GI evaluation underway. EGD ok save mild gastritis,esophagitis Rx H2RA. He was going to go to urgent care in am for phenergan. *Review of chart this am states he is a chronic cannabis user. This may be the cause. Note forwarded to Dr. Christella HartiganJacobs

## 2017-02-28 NOTE — Telephone Encounter (Signed)
Patient picked up forms at 11:56 am

## 2017-02-28 NOTE — Telephone Encounter (Signed)
Completed.  Message sent to mychart.  Copy for scanning available. Paperwork given to [employer].

## 2017-03-01 ENCOUNTER — Encounter: Payer: Self-pay | Admitting: Family Medicine

## 2017-03-01 ENCOUNTER — Ambulatory Visit: Payer: 59 | Admitting: Family Medicine

## 2017-03-01 VITALS — BP 110/70 | HR 65 | Temp 98.1°F | Wt 205.9 lb

## 2017-03-01 DIAGNOSIS — R112 Nausea with vomiting, unspecified: Secondary | ICD-10-CM | POA: Diagnosis not present

## 2017-03-01 DIAGNOSIS — K859 Acute pancreatitis without necrosis or infection, unspecified: Secondary | ICD-10-CM | POA: Diagnosis not present

## 2017-03-01 LAB — LIPASE: Lipase: 36 U/L (ref 11.0–59.0)

## 2017-03-01 MED ORDER — ONDANSETRON 8 MG PO TBDP: 8.0000 mg | ORAL_TABLET | Freq: Three times a day (TID) | ORAL | 0 refills | Status: DC | PRN

## 2017-03-01 NOTE — Patient Instructions (Addendum)
Avoid all non-steroidal medications NO alcohol Get back on twice daily Pepcid or Zantac  Keep diet very bland- no fats, no fried foods.

## 2017-03-01 NOTE — Progress Notes (Signed)
Subjective:     Patient ID: Randy SlotChristopher Todd Neal, male   DOB: 04/07/1975, 42 y.o.   MRN: [id number]  HPI   Patient seen for follow-up with some recurrent upper GI symptoms. Refer to multiple previous notes. [place] which was his first episode. Etiology was unclear.    He was seen here in follow-up and had slightly persistent lipase. He was actually feeling better until last Friday when he had he states about 7 hours of some recurrent vomiting and nausea. One episode of vomiting last night but otherwise none. He's had some mild intermittent epigastric discomfort. He had recent EGD on January 8 which showed some mild gastritis, duodenitis, and mild esophagitis. Helicobacter pylori testing negative  He also complains of frequent belching. He was here today with plans to get follow-up lipase levels done. He has pending MRI abdomen and MRCP this Friday. Quit smoking couple months ago. He states he has avoided all nonsteroidals and alcohol for the past month. Had been taking some proton pump inhibitors with Prilosec previously but no improvement in symptoms. Appetite is slightly down but weight is actually stable.  No gallstones by recent ultrasound.    Past Medical History:  Diagnosis Date  . Acute pancreatitis   . Arthritis   . Depression   . GERD (gastroesophageal reflux disease)   . Hyperlipidemia   . Kidney stone    Past Surgical History:  Procedure Laterality Date  . KIDNEY STONE REMOVAL    . KIDNEY STONE SURGERY Right 04/20/1994    reports that he quit smoking about 2 weeks ago. His smoking use included cigarettes. He has a 30.00 pack-year smoking history. He uses smokeless tobacco. He reports that he drinks about 1.8 oz of alcohol per week. He reports that he uses drugs. Drugs: Marijuana and Cocaine. family history includes Alcohol abuse in his father and mother; Arthritis in his father and maternal grandmother; Breast cancer in his paternal  aunt; Cancer in his maternal aunt, maternal grandfather, and maternal grandmother; Diabetes in his mother; Liver disease in his paternal uncle; Lung cancer in his father; Prostate cancer in his maternal grandfather. Allergies  Allergen Reactions  . Doxycycline Rash     Review of Systems  Constitutional: Negative for activity change, appetite change, fatigue and fever.  HENT: Negative for congestion, ear pain and trouble swallowing.   Eyes: Negative for pain and visual disturbance.  Respiratory: Negative for cough, shortness of breath and wheezing.   Cardiovascular: Negative for chest pain and palpitations.  Gastrointestinal: Positive for abdominal pain, nausea and vomiting. Negative for abdominal distention, blood in stool, constipation, diarrhea and rectal pain.  Genitourinary: Negative for dysuria, hematuria and testicular pain.  Musculoskeletal: Negative for arthralgias and joint swelling.  Skin: Negative for rash.  Neurological: Negative for dizziness, syncope and headaches.  Hematological: Negative for adenopathy.  Psychiatric/Behavioral: Negative for confusion and dysphoric mood.       Objective:   Physical Exam  Constitutional: He appears well-developed and well-nourished.  Neck: Neck supple.  Cardiovascular: Normal rate and regular rhythm.  Pulmonary/Chest: Effort normal and breath sounds normal. No respiratory distress. He has no wheezes. He has no rales.  Abdominal: Soft. Bowel sounds are normal. He exhibits no distension and no mass. There is no tenderness. There is no rebound and no guarding.       Assessment:     #1 recent acute pancreatitis of unclear etiology. He's had some persistent epigastric pains and intermittent nausea and  vomiting. Nonacute abdomen  #2 recent EGD with findings including gastritis, duodenitis, mild esophagitis and negative H pylori .      Plan:     -Continue strict avoidance of alcohol and nonsteroidals -Recommended bland diet and avoid  any fatty foods -Start over-the-counter Zantac or Pepcid twice daily as per recommended by GI -Repeat lipase today -Patient has scheduled MRI and MRCP of abdomen for this Friday -Follow-up for any recurrent vomiting in the meantime or recurrent pain -rx for Zofran 8 mg ODT one every 8 hours prn nausea and vomiting.  Kristian Covey MD [facility]

## 2017-03-04 ENCOUNTER — Ambulatory Visit (HOSPITAL_COMMUNITY)
Admission: RE | Admit: 2017-03-04 | Discharge: 2017-03-04 | Disposition: A | Discharge status: Home / Self Care | Payer: 59 | Source: Ambulatory Visit | Attending: Gastroenterology | Admitting: Gastroenterology

## 2017-03-04 ENCOUNTER — Encounter: Payer: Self-pay | Admitting: Gastroenterology

## 2017-03-04 DIAGNOSIS — R109 Unspecified abdominal pain: Secondary | ICD-10-CM | POA: Insufficient documentation

## 2017-03-04 DIAGNOSIS — I700 Atherosclerosis of aorta: Secondary | ICD-10-CM | POA: Insufficient documentation

## 2017-03-04 DIAGNOSIS — K859 Acute pancreatitis without necrosis or infection, unspecified: Secondary | ICD-10-CM

## 2017-03-04 MED ORDER — GADOBENATE DIMEGLUMINE 529 MG/ML IV SOLN
20.0000 mL | Freq: Once | INTRAVENOUS | Status: AC | PRN
  Administered 2017-03-04: 20 mL via INTRAVENOUS

## 2017-03-07 ENCOUNTER — Telehealth: Payer: Self-pay

## 2017-03-07 NOTE — Telephone Encounter (Signed)
Advised of his results. Patient reports pain in the sterum. Feels"full."  Sleeps sitting up.

## 2017-03-07 NOTE — Telephone Encounter (Signed)
-----   Message from Rachael Feeaniel P Jacobs, MD sent at 03/07/2017  7:44 AM EST -----   Please call the patient.  The MRI abdomen was essentially normal; also noted recent lipase level was normal now.  No explanation for his symptoms.  Ask how he is feeling.  thanks

## 2017-03-09 ENCOUNTER — Encounter: Payer: Self-pay | Admitting: Gastroenterology

## 2017-03-09 NOTE — Telephone Encounter (Signed)
Dr Jacobs please advise  

## 2017-03-09 NOTE — Telephone Encounter (Signed)
Patient calling back from yesterday and pt emailed today. Pt states he is currently on medical leave from work and needs to know what to do for his gi symptoms. Pt requesting a cb.

## 2017-03-10 ENCOUNTER — Telehealth: Payer: Self-pay | Admitting: Gastroenterology

## 2017-03-10 ENCOUNTER — Encounter: Payer: Self-pay | Admitting: Family Medicine

## 2017-03-10 NOTE — Telephone Encounter (Signed)
The pt has been notified via My Chart  

## 2017-03-10 NOTE — Telephone Encounter (Signed)
Nothing is conclusive about any of his testing: labs, CT, MRI, EGD.  I have read that he uses cannibus chronically.  Can you explain that if that is indeed the case there is a very good chance that is causing his symptoms and he needs to try to completely stop the pot and any other illicit medicines.

## 2017-03-10 NOTE — Telephone Encounter (Signed)
See alternate note  

## 2017-03-10 NOTE — Telephone Encounter (Signed)
Dr Christella HartiganJacobs the pt has replied back stating he thought that marijuana was used to treat nausea?

## 2017-03-10 NOTE — Telephone Encounter (Signed)
Dr Randy Neal the pt is still complaining of nausea and vomiting.  He would like to know what else he can do?

## 2017-03-18 ENCOUNTER — Ambulatory Visit: Payer: Self-pay | Admitting: Family Medicine

## 2017-03-18 ENCOUNTER — Telehealth: Payer: Self-pay

## 2017-03-18 NOTE — Telephone Encounter (Signed)
I think his greatest concern is having a relapse with pancreatitis while he is out of town. Let's continue an extension of his work release until the 12th -until he sees GI

## 2017-03-18 NOTE — Telephone Encounter (Signed)
Spoke with pt and he will have his HR office fax form for completion.   Fleet Contrasachel - FYI. Thanks!

## 2017-03-18 NOTE — Telephone Encounter (Signed)
Pt came in to office wanting to ask about being seen by Burchette or GI. He is concerned because his STD ends today - if he is to go back to work he needs a release, if he is not he needs an extension. Pt has not scheduled with GI for follow up. Advised pt that per Dr Caryl NeverBurchette pt needs to follow up with them for continued symptoms. Called GI and scheduled pt for hospital follow up 03/29/17 @ 9am. Pt aware and agrees to be seen.   Dr. Caryl NeverBurchette - What would you like to do about his returning to work? Thanks!

## 2017-03-21 NOTE — Telephone Encounter (Signed)
done

## 2017-03-21 NOTE — Telephone Encounter (Signed)
Placed on Dr Burchette's desk 

## 2017-03-22 ENCOUNTER — Encounter: Payer: Self-pay | Admitting: *Deleted

## 2017-03-22 NOTE — Telephone Encounter (Signed)
Form ready for pick up and message sent to [facility]mychart

## 2017-03-29 ENCOUNTER — Encounter: Payer: Self-pay | Admitting: Gastroenterology

## 2017-03-29 ENCOUNTER — Ambulatory Visit: Payer: 59 | Admitting: Gastroenterology

## 2017-03-29 VITALS — BP 110/60 | HR 74 | Ht 69.0 in | Wt 212.0 lb

## 2017-03-29 DIAGNOSIS — R1013 Epigastric pain: Secondary | ICD-10-CM

## 2017-03-29 DIAGNOSIS — R142 Eructation: Secondary | ICD-10-CM

## 2017-03-29 MED ORDER — RANITIDINE HCL 150 MG PO TABS: 150.0000 mg | ORAL_TABLET | Freq: Three times a day (TID) | ORAL | 3 refills | Status: AC

## 2017-03-29 NOTE — Patient Instructions (Addendum)
We have sent the following medications to your pharmacy for you to pick up at your convenience: Zantac 150 mg three times a day

## 2017-03-29 NOTE — Progress Notes (Signed)
03/29/2017 Randy SlotChristopher Todd Neal [id number] 08/30/1975   HISTORY OF PRESENT ILLNESS:  This is a 42 year old male who is known to Dr. Christella HartiganJacobs for complaints of epigastric abdominal pain.  Had EGD in January 2019 that showed some mild esophagitis and gastritis.  Gastric biopsies were normal, negative for Hpylori.  He has a history of pancreatitis and had an elevated lipase at 171 one month ago but CT scan abdomen and pelvis with contrast and MR abdomen/MRCP were unremarkable.  Repeat lipase had normalized.  LFT's, BMP, and CBC are normal.  He presents here today for follow-up of his symptoms.  Overall symptoms have been present for over a year.  He tells me that he did not have any improvement while on PPI but since beginning zantac 150 mg BID he has noticed improvement.  He still reports a lot of belching and belches several times during our visit today.  Has very pressured speech.  Tells me that he has not smoke cigarettes or marijuana in about 90 days.  He tells me that he would like to continue taking the zantac and asking how much he can take, can he take more than twice a day.   Past Medical History:  Diagnosis Date  . Acute pancreatitis   . Arthritis   . Depression   . GERD (gastroesophageal reflux disease)   . Hyperlipidemia   . Kidney stone    Past Surgical History:  Procedure Laterality Date  . KIDNEY STONE REMOVAL    . KIDNEY STONE SURGERY Right 04/20/1994    reports that he quit smoking about 6 weeks ago. His smoking use included cigarettes. He has a 30.00 pack-year smoking history. He uses smokeless tobacco. He reports that he drinks about 1.8 oz of alcohol per week. He reports that he uses drugs. Drugs: Marijuana and Cocaine. family history includes Alcohol abuse in his father and mother; Arthritis in his father and maternal grandmother; Breast cancer in his paternal aunt; Cancer in his maternal aunt, maternal grandfather, and maternal grandmother; Diabetes in his mother; Liver  disease in his paternal uncle; Lung cancer in his father; Prostate cancer in his maternal grandfather. Allergies  Allergen Reactions  . Doxycycline Rash      Outpatient Encounter Medications as of 03/29/2017  Medication Sig  . ranitidine (ZANTAC) 150 MG/10ML syrup Take 150 mg by mouth 2 (two) times daily.  . [DISCONTINUED] nystatin cream (MYCOSTATIN) Apply 1 application topically 2 (two) times daily. (Patient not taking: Reported on 03/29/2017)  . [DISCONTINUED] ondansetron (ZOFRAN ODT) 8 MG disintegrating tablet Take 1 tablet (8 mg total) by mouth every 8 (eight) hours as needed for nausea or vomiting. (Patient not taking: Reported on 03/29/2017)   No facility-administered encounter medications on file as of 03/29/2017.      REVIEW OF SYSTEMS  : All other systems reviewed and negative except where noted in the History of Present Illness.   PHYSICAL EXAM: BP 110/60   Pulse 74   Ht 5\' 9"  (1.753 m)   Wt 212 lb (96.2 kg)   BMI 31.31 kg/m  General: Well developed white male in no acute distress; appears anxious with fast, pressured speech Head: Normocephalic and atraumatic Eyes:  Sclerae anicteric, conjunctiva pink. Ears: Normal auditory acuity Lungs: Clear throughout to auscultation; no increased WOB Heart: Regular rate and rhythm; no M/R/G. Abdomen: Soft, non-distended.  BS present.  Non-tender. Musculoskeletal: Symmetrical with no gross deformities  Skin: No lesions on visible extremities Extremities: No edema  Neurological:  Alert oriented x 4, grossly non-focal Psychological:  Alert and cooperative. Normal mood and affect  ASSESSMENT AND PLAN: *42 year old male with complaints of belching with epigastric abdominal pain and GERD:  He did have mild esophagitis c/w GERD on EGD but I think that his belching and some of his symptoms may be related to aerophagia/anxiety.  He reports no improvement on PPI previously but feels better since taking zantac 150 mg BID.  He can continue that  for now and will give him a prescription.  I will prescribe for 150 mg TID in case he needs an extra mid-day dose.     CC:  Kristian Covey, MD

## 2017-04-06 DIAGNOSIS — R1013 Epigastric pain: Secondary | ICD-10-CM | POA: Insufficient documentation

## 2017-04-06 DIAGNOSIS — R142 Eructation: Secondary | ICD-10-CM | POA: Insufficient documentation

## 2017-04-06 NOTE — Progress Notes (Signed)
I agree with the above note, plan.   Thanks   

## 2017-12-06 ENCOUNTER — Other Ambulatory Visit: Payer: Self-pay

## 2017-12-06 ENCOUNTER — Emergency Department (HOSPITAL_COMMUNITY): Payer: 59

## 2017-12-06 ENCOUNTER — Encounter (HOSPITAL_COMMUNITY): Payer: Self-pay | Admitting: Emergency Medicine

## 2017-12-06 ENCOUNTER — Emergency Department (HOSPITAL_COMMUNITY)
Admission: EM | Admit: 2017-12-06 | Discharge: 2017-12-06 | Disposition: A | Discharge status: Home / Self Care | Payer: 59 | Attending: Emergency Medicine | Admitting: Emergency Medicine

## 2017-12-06 DIAGNOSIS — R197 Diarrhea, unspecified: Secondary | ICD-10-CM

## 2017-12-06 DIAGNOSIS — R1084 Generalized abdominal pain: Secondary | ICD-10-CM | POA: Diagnosis not present

## 2017-12-06 DIAGNOSIS — E785 Hyperlipidemia, unspecified: Secondary | ICD-10-CM | POA: Diagnosis not present

## 2017-12-06 DIAGNOSIS — R112 Nausea with vomiting, unspecified: Secondary | ICD-10-CM | POA: Insufficient documentation

## 2017-12-06 DIAGNOSIS — Z79899 Other long term (current) drug therapy: Secondary | ICD-10-CM | POA: Diagnosis not present

## 2017-12-06 DIAGNOSIS — R1013 Epigastric pain: Secondary | ICD-10-CM | POA: Diagnosis present

## 2017-12-06 LAB — CBC
HCT: 47 % (ref 39.0–52.0)
HEMOGLOBIN: 15.3 g/dL (ref 13.0–17.0)
MCH: 30.1 pg (ref 26.0–34.0)
MCHC: 32.6 g/dL (ref 30.0–36.0)
MCV: 92.3 fL (ref 80.0–100.0)
Platelets: 306 10*3/uL (ref 150–400)
RBC: 5.09 MIL/uL (ref 4.22–5.81)
RDW: 13 % (ref 11.5–15.5)
WBC: 8.6 10*3/uL (ref 4.0–10.5)
nRBC: 0 % (ref 0.0–0.2)

## 2017-12-06 LAB — COMPREHENSIVE METABOLIC PANEL
ALBUMIN: 4.5 g/dL (ref 3.5–5.0)
ALT: 35 U/L (ref 0–44)
AST: 19 U/L (ref 15–41)
Alkaline Phosphatase: 74 U/L (ref 38–126)
Anion gap: 9 (ref 5–15)
BUN: 14 mg/dL (ref 6–20)
CO2: 23 mmol/L (ref 22–32)
CREATININE: 0.83 mg/dL (ref 0.61–1.24)
Calcium: 9.9 mg/dL (ref 8.9–10.3)
Chloride: 107 mmol/L (ref 98–111)
GFR calc Af Amer: >60 mL/min (ref >60)
GLUCOSE: 103 mg/dL — AB (ref 70–99)
POTASSIUM: 3.9 mmol/L (ref 3.5–5.1)
SODIUM: 139 mmol/L (ref 135–145)
Total Bilirubin: 0.6 mg/dL (ref 0.3–1.2)
Total Protein: 8.2 g/dL — ABNORMAL HIGH (ref 6.5–8.1)

## 2017-12-06 LAB — LIPASE, BLOOD: LIPASE: 36 U/L (ref 11–51)

## 2017-12-06 MED ORDER — ONDANSETRON HCL 4 MG/2ML IJ SOLN
4.0000 mg | Freq: Once | INTRAMUSCULAR | Status: AC
  Administered 2017-12-06: 4 mg via INTRAVENOUS
  Filled 2017-12-06: qty 2

## 2017-12-06 MED ORDER — MORPHINE SULFATE (PF) 4 MG/ML IV SOLN: 4.0000 mg | Freq: Once | INTRAVENOUS | Status: DC

## 2017-12-06 MED ORDER — MORPHINE SULFATE (PF) 4 MG/ML IV SOLN
4.0000 mg | Freq: Once | INTRAVENOUS | Status: AC
  Administered 2017-12-06: 4 mg via INTRAVENOUS
  Filled 2017-12-06: qty 1

## 2017-12-06 MED ORDER — LIDOCAINE VISCOUS HCL 2 % MT SOLN
15.0000 mL | Freq: Once | OROMUCOSAL | Status: AC
  Administered 2017-12-06: 15 mL via ORAL
  Filled 2017-12-06: qty 15

## 2017-12-06 MED ORDER — SODIUM CHLORIDE 0.9 % IJ SOLN
INTRAMUSCULAR | Status: AC
  Filled 2017-12-06: qty 50

## 2017-12-06 MED ORDER — IOPAMIDOL (ISOVUE-300) INJECTION 61%
INTRAVENOUS | Status: AC
  Filled 2017-12-06: qty 100

## 2017-12-06 MED ORDER — IOPAMIDOL (ISOVUE-300) INJECTION 61%
100.0000 mL | Freq: Once | INTRAVENOUS | Status: AC | PRN
  Administered 2017-12-06: 100 mL via INTRAVENOUS

## 2017-12-06 MED ORDER — ALUM & MAG HYDROXIDE-SIMETH 200-200-20 MG/5ML PO SUSP
30.0000 mL | Freq: Once | ORAL | Status: AC
  Administered 2017-12-06: 30 mL via ORAL
  Filled 2017-12-06: qty 30

## 2017-12-06 MED ORDER — SODIUM CHLORIDE 0.9 % IV BOLUS
1000.0000 mL | Freq: Once | INTRAVENOUS | Status: AC
  Administered 2017-12-06: 1000 mL via INTRAVENOUS

## 2017-12-06 MED ORDER — ONDANSETRON 4 MG PO TBDP: 4.0000 mg | ORAL_TABLET | Freq: Three times a day (TID) | ORAL | 0 refills | Status: DC | PRN

## 2017-12-06 NOTE — Discharge Instructions (Addendum)
Evaluated today for abdominal pain.  Your CT scan showed generalized enteritis, or abdominal inflammation.  Return to the ED with any new or worsening symptoms such as:  Get help right away if: Your pain does not go away as soon as your doctor says it should. You cannot stop throwing up. Your pain is only in areas of your belly, such as the right side or the left lower part of the belly. You have bloody or black poop, or poop that looks like tar. You have very bad pain, cramping, or bloating in your belly. You have signs of not having enough fluid or water in your body (dehydration), such as: Dark pee, very little pee, or no pee. Cracked lips. Dry mouth. Sunken eyes. Sleepiness. Weakness.

## 2017-12-06 NOTE — ED Provider Notes (Signed)
[facility]-EMERGENCY DEPT Provider Note   CSN: [id number] Arrival date & time: 12/06/17  1815   History   Chief Complaint Chief Complaint  Patient presents with  . Abdominal Pain    HPI Randy Neal is a 42 y.o. male past medical history significant for acute pancreatitis and hyperlipidemia for evaluation of epiastric and left lower quadrant abdominal pain.  Pain started approximately 6 hours ago.  Pain has been gradually increasing.  Pain is intermittent in nature.  Pain does not radiate.  Pain is worse with movement.  Admits to nausea, nonbloody emesis and nonbloody diarrhea. Denies fever, chills, chest pain, shortness of breath, dysuria, radiation of pain to his back, melena or bright red blood per rectum, back pain.  History of chronic epigastric pain, followed by [facility] gastroenterology.  HPI  Past Medical History:  Diagnosis Date  . Acute pancreatitis   . Arthritis   . Depression   . GERD (gastroesophageal reflux disease)   . Hyperlipidemia   . Kidney stone     Patient Active Problem List   Diagnosis Date Noted  . Belching 04/06/2017  . Abdominal pain, epigastric 04/06/2017  . Acute pancreatitis 02/20/2017  . Substance induced mood disorder (HCC) 01/04/2013  . Cocaine abuse with intoxication and without complication (HCC) 01/04/2013  . Cannabis dependence with intoxication (HCC) 01/04/2013  . Bipolar disorder, unspecified (HCC) 01/04/2013    Past Surgical History:  Procedure Laterality Date  . KIDNEY STONE REMOVAL    . KIDNEY STONE SURGERY Right 04/20/1994        Home Medications    Prior to Admission medications   Medication Sig Start Date End Date Taking? Authorizing Provider  ARIPiprazole (ABILIFY) 5 MG tablet Take 5 mg by mouth daily.  11/30/17 12/30/17 Yes [provider]  atorvastatin (LIPITOR) 40 MG tablet Take 40 mg by mouth daily. 10/28/17  Yes [provider]  buPROPion (WELLBUTRIN XL) 150 MG 24  hr tablet Take 150 mg by mouth daily.  10/23/17  Yes [provider]  DEXILANT 60 MG capsule Take 60 mg by mouth daily.  11/29/17  Yes [provider]  lamoTRIgine (LAMICTAL) 25 MG tablet Take 25 mg by mouth daily.  11/23/17  Yes [provider]  simethicone (GAS-X) 80 MG chewable tablet Chew 80 mg by mouth every 6 (six) hours as needed for flatulence.   Yes [provider]  ondansetron (ZOFRAN ODT) 4 MG disintegrating tablet Take 1 tablet (4 mg total) by mouth every 8 (eight) hours as needed for nausea or vomiting. 12/06/17   Basem Yannuzzi A, PA-C  ranitidine (ZANTAC) 150 MG tablet Take 1 tablet (150 mg total) by mouth 3 (three) times daily. Patient not taking: Reported on 12/06/2017 03/29/17   Zehr, Princella Pellegrini, PA-C    Family History Family History  Problem Relation Age of Onset  . Alcohol abuse Mother   . Diabetes Mother   . Alcohol abuse Father   . Arthritis Father   . Lung cancer Father   . Cancer Maternal Aunt   . Arthritis Maternal Grandmother   . Cancer Maternal Grandmother   . Cancer Maternal Grandfather   . Prostate cancer Maternal Grandfather   . Breast cancer Paternal Aunt   . Liver disease Paternal Uncle     Social History Social History   Tobacco Use  . Smoking status: Former Smoker    Packs/day: 2.00    Years: 15.00    Pack years: 30.00    Types:  Cigarettes    Last attempt to quit: 02/15/2017    Years since quitting: 0.8  . Smokeless tobacco: Current User  . Tobacco comment: patch  Substance Use Topics  . Alcohol use: Yes    Alcohol/week: 3.0 standard drinks    Types: 3 Cans of beer per week    Comment: everyday  . Drug use: Yes    Types: Marijuana, Cocaine     Allergies   Doxycycline   Review of Systems Review of Systems  Respiratory: Negative.   Cardiovascular: Negative.   Gastrointestinal: Positive for abdominal pain, diarrhea, nausea and vomiting. Negative for abdominal distention, blood in stool and  constipation.  Genitourinary: Negative.   Musculoskeletal: Negative.   Skin: Negative.   Neurological: Negative.   All other systems reviewed and are negative.    Physical Exam Updated Vital Signs BP 125/81 (BP Location: Right Arm)   Pulse 65   Temp 97.7 F (36.5 C) (Oral)   Resp 15   SpO2 94%   Physical Exam  Constitutional: He appears well-developed and well-nourished. No distress.  HENT:  Head: Atraumatic.  Mouth/Throat: Oropharynx is clear and moist.  Eyes: Pupils are equal, round, and reactive to light.  Neck: Normal range of motion. Neck supple.  Cardiovascular: Normal rate, regular rhythm, normal heart sounds and intact distal pulses. Exam reveals no gallop and no friction rub.  No murmur heard. Pulmonary/Chest: Effort normal and breath sounds normal. No stridor. No respiratory distress. He has no wheezes. He has no rhonchi. He has no rales. He exhibits no tenderness.  Abdominal: Soft. Normal appearance, normal aorta and bowel sounds are normal. He exhibits no distension. There is no hepatosplenomegaly. There is tenderness in the epigastric area and left lower quadrant. There is no rigidity, no rebound, no guarding, no CVA tenderness, no tenderness at McBurney's point and negative Murphy's sign. No hernia.  Musculoskeletal: Normal range of motion.  Neurological: He is alert.  Skin: Skin is warm and dry. He is not diaphoretic.  Psychiatric: He has a normal mood and affect.  Nursing note and vitals reviewed.    ED Treatments / Results  Labs (all labs ordered are listed, but only abnormal results are displayed) Labs Reviewed  COMPREHENSIVE METABOLIC PANEL - Abnormal; Notable for the following components:      Result Value   Glucose, Bld 103 (*)    Total Protein 8.2 (*)    All other components within normal limits  LIPASE, BLOOD  CBC  URINALYSIS, ROUTINE W REFLEX MICROSCOPIC    EKG None  Radiology Ct Abdomen Pelvis W Contrast  Result Date:  12/06/2017 CLINICAL DATA:  Acute upper abdominal pain. History of pancreatitis. EXAM: CT ABDOMEN AND PELVIS WITH CONTRAST TECHNIQUE: Multidetector CT imaging of the abdomen and pelvis was performed using the standard protocol following bolus administration of intravenous contrast. CONTRAST:  ISOVUE-300 IOPAMIDOL (ISOVUE-300) INJECTION 61% COMPARISON:  Abdominal MRI 03/04/2017. Abdominal CT 12/24/2016. Remote abdominal CT 03/04/2004 FINDINGS: Lower chest: Mild hypoventilatory changes at the bases. No pleural effusion or confluent airspace disease. Hepatobiliary: Unchanged small subcapsular cysts in the right lobe of the liver. Focal fatty infiltration adjacent to the falciform ligament. No new abnormality. Gallbladder is partially distended. No calcified gallstone, pericholecystic inflammation, or biliary dilatation. Pancreas: Unremarkable. Particularly, no evidence of pancreatic inflammation. No ductal dilatation. Spleen: Normal in size without focal abnormality. Adrenals/Urinary Tract: Normal adrenal glands. No hydronephrosis or perinephric edema. Homogeneous renal enhancement with symmetric excretion on delayed phase imaging. Punctate nonobstructing stone in the lower  left kidney. Small cyst in the right kidney is unchanged. Unchanged 2 cm cyst adjacent to the left ureteral insertion/urinary trigone, stable dating back to 2006. Urinary bladder is physiologically distended without wall thickening. Stomach/Bowel: Mild gastric distention with air-fluid level, no gastric wall thickening. Small bowel diffusely fluid-filled but nondilated and noninflamed. There is liquid stool throughout the entire colon without colonic wall thickening or inflammation. The appendix a similarly fluid-filled but without periappendiceal inflammation, and air the appendiceal tip. Vascular/Lymphatic: Aorta bi-iliac atherosclerosis. No aneurysm. Few prominent ileocolic nodes are likely reactive. No enlarged lymph nodes in the abdomen  or pelvis. Reproductive: Prostate is unremarkable. Other: Small fat containing umbilical hernia. No free air, free fluid, or intra-abdominal fluid collection. Musculoskeletal: There are no acute or suspicious osseous abnormalities. IMPRESSION: 1. Fluid-filled stomach, small and large bowel suggesting generalized enteritis. No significant bowel inflammation. 2. Punctate nonobstructing stone in the lower left kidney. 3. 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 4.  Aortic Atherosclerosis (ICD10-I70.0), advanced for age. Electronically Signed   By: Narda Rutherford M.D.   On: 12/06/2017 22:32    Procedures Procedures (including critical care time)  Medications Ordered in ED Medications  iopamidol (ISOVUE-300) 61 % injection (has no administration in time range)  sodium chloride 0.9 % injection (has no administration in time range)  morphine 4 MG/ML injection 4 mg (has no administration in time range)  sodium chloride 0.9 % bolus 1,000 mL (1,000 mLs Intravenous New Bag/Given 12/06/17 2130)  morphine 4 MG/ML injection 4 mg (4 mg Intravenous Given 12/06/17 2130)  alum & mag hydroxide-simeth (MAALOX/MYLANTA) 200-200-20 MG/5ML suspension 30 mL (30 mLs Oral Given 12/06/17 2115)    And  lidocaine (XYLOCAINE) 2 % viscous mouth solution 15 mL (15 mLs Oral Given 12/06/17 2114)  ondansetron (ZOFRAN) injection 4 mg (4 mg Intravenous Given 12/06/17 2141)  iopamidol (ISOVUE-300) 61 % injection 100 mL (100 mLs Intravenous Contrast Given 12/06/17 2158)     Initial Impression / Assessment and Plan / ED Course  I have reviewed the triage vital signs and the nursing notes.  Pertinent labs & imaging results that were available during my care of the patient were reviewed by me and considered in my medical decision making (see chart for details).  42 year old male presents for evaluation of  epigastric and left lower quadrant pain.  Pain onset 6 hours ago.  Intermittent in nature.  Mild tenderness to palpation to epigastric and left lower quadrant with guarding.  No rebound or rigidity.  Pain does not radiate to back. History of pancreatitis.  Will obtain labs and reevaluate.  Labs without leukocytosis, lipase 36, CMP negative.  Will obtain CT scan given patient's pain and negative lipase. Fluids and pain medication and reevaluate.  Pain controlled. CT with generalized enteritis. Patient is nontoxic, nonseptic appearing, in no apparent distress.  Patient's pain and other symptoms adequately managed in emergency department. Patient does not meet the SIRS or Sepsis criteria.  On repeat exam patient does not have a surgical abdomin and there are no peritoneal signs.  No indication of appendicitis, bowel obstruction, bowel perforation, cholecystitis, diverticulitis.  Patient discharged home with symptomatic treatment and given strict instructions for follow-up with their primary care physician as well as his Gastroenterologist at [facility].  I have also discussed reasons to return immediately to the ER. Patient expresses understanding and agrees with plan.    Final Clinical Impressions(s) /  ED Diagnoses   Final diagnoses:  Generalized abdominal pain    ED Discharge Orders         Ordered    ondansetron (ZOFRAN ODT) 4 MG disintegrating tablet  Every 8 hours PRN     12/06/17 2246           Quinisha Mould A, PA-C 12/06/17 2313    Arby Barrette, MD 12/07/17 1606

## 2017-12-06 NOTE — ED Triage Notes (Signed)
Patient c/o upper abdominal pain with N/V today. Hx pancreatitis. Reports sx mimic previous pancreatitis dx. Reports drinking 2-3 drinks daily.

## 2017-12-07 ENCOUNTER — Emergency Department (HOSPITAL_COMMUNITY): Payer: 59

## 2017-12-07 ENCOUNTER — Emergency Department (HOSPITAL_COMMUNITY)
Admission: EM | Admit: 2017-12-07 | Discharge: 2017-12-07 | Disposition: A | Discharge status: Home / Self Care | Payer: 59 | Attending: Emergency Medicine | Admitting: Emergency Medicine

## 2017-12-07 ENCOUNTER — Encounter (HOSPITAL_COMMUNITY): Payer: Self-pay

## 2017-12-07 DIAGNOSIS — Z79899 Other long term (current) drug therapy: Secondary | ICD-10-CM | POA: Diagnosis not present

## 2017-12-07 DIAGNOSIS — R112 Nausea with vomiting, unspecified: Secondary | ICD-10-CM | POA: Diagnosis not present

## 2017-12-07 DIAGNOSIS — Z87891 Personal history of nicotine dependence: Secondary | ICD-10-CM | POA: Diagnosis not present

## 2017-12-07 DIAGNOSIS — R1110 Vomiting, unspecified: Secondary | ICD-10-CM | POA: Diagnosis present

## 2017-12-07 MED ORDER — PROMETHAZINE HCL 25 MG RE SUPP: 25.0000 mg | Freq: Four times a day (QID) | RECTAL | 0 refills | Status: DC | PRN

## 2017-12-07 MED ORDER — SODIUM CHLORIDE 0.9 % IV BOLUS
1000.0000 mL | Freq: Once | INTRAVENOUS | Status: AC
  Administered 2017-12-07: 1000 mL via INTRAVENOUS

## 2017-12-07 MED ORDER — METOCLOPRAMIDE HCL 5 MG/ML IJ SOLN
10.0000 mg | Freq: Once | INTRAMUSCULAR | Status: AC
  Administered 2017-12-07: 10 mg via INTRAVENOUS
  Filled 2017-12-07: qty 2

## 2017-12-07 MED ORDER — ONDANSETRON HCL 4 MG/2ML IJ SOLN
4.0000 mg | Freq: Once | INTRAMUSCULAR | Status: AC
  Administered 2017-12-07: 4 mg via INTRAVENOUS
  Filled 2017-12-07: qty 2

## 2017-12-07 MED ORDER — HYDROMORPHONE HCL 1 MG/ML IJ SOLN
1.0000 mg | Freq: Once | INTRAMUSCULAR | Status: AC
  Administered 2017-12-07: 1 mg via INTRAVENOUS
  Filled 2017-12-07: qty 1

## 2017-12-07 MED ORDER — DIPHENHYDRAMINE HCL 50 MG/ML IJ SOLN
25.0000 mg | Freq: Once | INTRAMUSCULAR | Status: AC
  Administered 2017-12-07: 25 mg via INTRAVENOUS
  Filled 2017-12-07: qty 1

## 2017-12-07 MED ORDER — PROMETHAZINE HCL 25 MG PO TABS: 25.0000 mg | ORAL_TABLET | Freq: Four times a day (QID) | ORAL | 0 refills | Status: DC | PRN

## 2017-12-07 NOTE — ED Notes (Signed)
Ultrasound at bedside

## 2017-12-07 NOTE — ED Triage Notes (Signed)
Pt was seen earlier for the same, he says the pain became worse when he got home and he had zofran to take with no relief, pt continues to have vomiting and diarrhea

## 2017-12-07 NOTE — ED Provider Notes (Signed)
[facility]-EMERGENCY DEPT Provider Note   CSN: [id number] Arrival date & time: 12/07/17  0231     History   Chief Complaint Chief Complaint  Patient presents with  . Emesis    HPI Randy Neal is a 42 y.o. male.  Patient presents to the emergency department for evaluation of nausea vomiting.  Patient was seen here in this department earlier today.  He has been experiencing severe upper abdominal pain and cramping through the course of the day with nausea and vomiting.  Patient reports that as soon as he got home today he started to have vomiting again.  He has been unable to hold anything down and his pain is now severe again.     Past Medical History:  Diagnosis Date  . Acute pancreatitis   . Arthritis   . Depression   . GERD (gastroesophageal reflux disease)   . Hyperlipidemia   . Kidney stone     Patient Active Problem List   Diagnosis Date Noted  . Belching 04/06/2017  . Abdominal pain, epigastric 04/06/2017  . Acute pancreatitis 02/20/2017  . Substance induced mood disorder (HCC) 01/04/2013  . Cocaine abuse with intoxication and without complication (HCC) 01/04/2013  . Cannabis dependence with intoxication (HCC) 01/04/2013  . Bipolar disorder, unspecified (HCC) 01/04/2013    Past Surgical History:  Procedure Laterality Date  . KIDNEY STONE REMOVAL    . KIDNEY STONE SURGERY Right 04/20/1994        Home Medications    Prior to Admission medications   Medication Sig Start Date End Date Taking? Authorizing Provider  ARIPiprazole (ABILIFY) 5 MG tablet Take 5 mg by mouth daily.  11/30/17 12/30/17 Yes [provider]  atorvastatin (LIPITOR) 40 MG tablet Take 40 mg by mouth daily. 10/28/17  Yes [provider]  buPROPion (WELLBUTRIN XL) 150 MG 24 hr tablet Take 150 mg by mouth daily.  10/23/17  Yes [provider]  DEXILANT 60 MG capsule Take 60 mg by mouth daily.  11/29/17  Yes [provider]    lamoTRIgine (LAMICTAL) 25 MG tablet Take 25 mg by mouth daily.  11/23/17  Yes [provider]  simethicone (GAS-X) 80 MG chewable tablet Chew 80 mg by mouth every 6 (six) hours as needed for flatulence.   Yes [provider]  ondansetron (ZOFRAN ODT) 4 MG disintegrating tablet Take 1 tablet (4 mg total) by mouth every 8 (eight) hours as needed for nausea or vomiting. 12/06/17   Henderly, Britni A, PA-C  promethazine (PHENERGAN) 25 MG suppository Place 1 suppository (25 mg total) rectally every 6 (six) hours as needed for nausea or vomiting. 12/07/17   Gilda Crease, MD  promethazine (PHENERGAN) 25 MG tablet Take 1 tablet (25 mg total) by mouth every 6 (six) hours as needed for nausea or vomiting. 12/07/17   Gilda Crease, MD  ranitidine (ZANTAC) 150 MG tablet Take 1 tablet (150 mg total) by mouth 3 (three) times daily. Patient not taking: Reported on 12/06/2017 03/29/17   Zehr, Princella Pellegrini, PA-C    Family History Family History  Problem Relation Age of Onset  . Alcohol abuse Mother   . Diabetes Mother   . Alcohol abuse Father   . Arthritis Father   . Lung cancer Father   . Cancer Maternal Aunt   . Arthritis Maternal Grandmother   . Cancer Maternal Grandmother   . Cancer Maternal Grandfather   . Prostate cancer Maternal Grandfather   . Breast cancer  Paternal Aunt   . Liver disease Paternal Uncle     Social History Social History   Tobacco Use  . Smoking status: Former Smoker    Packs/day: 2.00    Years: 15.00    Pack years: 30.00    Types: Cigarettes    Last attempt to quit: 02/15/2017    Years since quitting: 0.8  . Smokeless tobacco: Current User  . Tobacco comment: patch  Substance Use Topics  . Alcohol use: Yes    Alcohol/week: 3.0 standard drinks    Types: 3 Cans of beer per week    Comment: everyday  . Drug use: Yes    Types: Marijuana, Cocaine     Allergies   Doxycycline   Review of Systems Review of Systems   Gastrointestinal: Positive for abdominal pain, nausea and vomiting.  All other systems reviewed and are negative.    Physical Exam Updated Vital Signs BP (!) 96/56 (BP Location: Left Arm) Comment: Amber, RN aware  Pulse 60   Temp 97.9 F (36.6 C) (Oral)   Resp 16   SpO2 98%   Physical Exam  Constitutional: He is oriented to person, place, and time. He appears well-developed and well-nourished. No distress.  HENT:  Head: Normocephalic and atraumatic.  Right Ear: Hearing normal.  Left Ear: Hearing normal.  Nose: Nose normal.  Mouth/Throat: Oropharynx is clear and moist and mucous membranes are normal.  Eyes: Pupils are equal, round, and reactive to light. Conjunctivae and EOM are normal.  Neck: Normal range of motion. Neck supple.  Cardiovascular: Regular rhythm, S1 normal and S2 normal. Exam reveals no gallop and no friction rub.  No murmur heard. Pulmonary/Chest: Effort normal and breath sounds normal. No respiratory distress. He exhibits no tenderness.  Abdominal: Soft. Normal appearance and bowel sounds are normal. There is no hepatosplenomegaly. There is tenderness in the epigastric area. There is no rebound, no guarding, no tenderness at McBurney's point and negative Murphy's sign. No hernia.  Musculoskeletal: Normal range of motion.  Neurological: He is alert and oriented to person, place, and time. He has normal strength. No cranial nerve deficit or sensory deficit. Coordination normal. GCS eye subscore is 4. GCS verbal subscore is 5. GCS motor subscore is 6.  Skin: Skin is warm, dry and intact. No rash noted. No cyanosis.  Psychiatric: He has a normal mood and affect. His speech is normal and behavior is normal. Thought content normal.  Nursing note and vitals reviewed.    ED Treatments / Results  Labs (all labs ordered are listed, but only abnormal results are displayed) Labs Reviewed - No data to display  EKG None  Radiology Ct Abdomen Pelvis W  Contrast  Result Date: 12/06/2017 CLINICAL DATA:  Acute upper abdominal pain. History of pancreatitis. EXAM: CT ABDOMEN AND PELVIS WITH CONTRAST TECHNIQUE: Multidetector CT imaging of the abdomen and pelvis was performed using the standard protocol following bolus administration of intravenous contrast. CONTRAST:  ISOVUE-300 IOPAMIDOL (ISOVUE-300) INJECTION 61% COMPARISON:  Abdominal MRI 03/04/2017. Abdominal CT 12/24/2016. Remote abdominal CT 03/04/2004 FINDINGS: Lower chest: Mild hypoventilatory changes at the bases. No pleural effusion or confluent airspace disease. Hepatobiliary: Unchanged small subcapsular cysts in the right lobe of the liver. Focal fatty infiltration adjacent to the falciform ligament. No new abnormality. Gallbladder is partially distended. No calcified gallstone, pericholecystic inflammation, or biliary dilatation. Pancreas: Unremarkable. Particularly, no evidence of pancreatic inflammation. No ductal dilatation. Spleen: Normal in size without focal abnormality. Adrenals/Urinary Tract: Normal adrenal glands. No hydronephrosis or  perinephric edema. Homogeneous renal enhancement with symmetric excretion on delayed phase imaging. Punctate nonobstructing stone in the lower left kidney. Small cyst in the right kidney is unchanged. Unchanged 2 cm cyst adjacent to the left ureteral insertion/urinary trigone, stable dating back to 2006. Urinary bladder is physiologically distended without wall thickening. Stomach/Bowel: Mild gastric distention with air-fluid level, no gastric wall thickening. Small bowel diffusely fluid-filled but nondilated and noninflamed. There is liquid stool throughout the entire colon without colonic wall thickening or inflammation. The appendix a similarly fluid-filled but without periappendiceal inflammation, and air the appendiceal tip. Vascular/Lymphatic: Aorta bi-iliac atherosclerosis. No aneurysm. Few prominent ileocolic nodes are likely reactive. No enlarged  lymph nodes in the abdomen or pelvis. Reproductive: Prostate is unremarkable. Other: Small fat containing umbilical hernia. No free air, free fluid, or intra-abdominal fluid collection. Musculoskeletal: There are no acute or suspicious osseous abnormalities. IMPRESSION: 1. Fluid-filled stomach, small and large bowel suggesting generalized enteritis. No significant bowel inflammation. 2. Punctate nonobstructing stone in the lower left kidney. 3. 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 4.  Aortic Atherosclerosis (ICD10-I70.0), advanced for age. Electronically Signed   By: Narda Rutherford M.D.   On: 12/06/2017 22:32   US Abdomen Limited Ruq  Result Date: 12/07/2017 CLINICAL DATA:  Upper abdominal pain EXAM: ULTRASOUND ABDOMEN LIMITED RIGHT UPPER QUADRANT COMPARISON:  None. FINDINGS: Gallbladder: No gallstones or wall thickening visualized. No sonographic Murphy sign noted by sonographer. Common bile duct: Diameter: 3 mm Liver: No focal lesion identified. Within normal limits in parenchymal echogenicity. Portal vein is patent on color Doppler imaging with normal direction of blood flow towards the liver. IMPRESSION: Normal right upper quadrant ultrasound. Electronically Signed   By: Deatra Robinson M.D.   On: 12/07/2017 05:29    Procedures Procedures (including critical care time)  Medications Ordered in ED Medications  HYDROmorphone (DILAUDID) injection 1 mg (has no administration in time range)  ondansetron (ZOFRAN) injection 4 mg (has no administration in time range)  sodium chloride 0.9 % bolus 1,000 mL (0 mLs Intravenous Stopped 12/07/17 0616)    Followed by  sodium chloride 0.9 % bolus 1,000 mL (1,000 mLs Intravenous New Bag/Given 12/07/17 0500)  ondansetron (ZOFRAN) injection 4 mg (4 mg Intravenous Given 12/07/17 0454)  metoCLOPramide (REGLAN) injection 10 mg (10 mg Intravenous  Given 12/07/17 0455)  diphenhydrAMINE (BENADRYL) injection 25 mg (25 mg Intravenous Given 12/07/17 0457)     Initial Impression / Assessment and Plan / ED Course  I have reviewed the triage vital signs and the nursing notes.  Pertinent labs & imaging results that were available during my care of the patient were reviewed by me and considered in my medical decision making (see chart for details).     Patient presents to the emergency department for nausea and vomiting.  Patient seen earlier, had thorough work-up which revealed no acute abnormality.  Patient reports onset of nausea, vomiting and abdominal pain after going home and starting to try and drink some water.  Patient reports intermittent stabbing pains in the upper abdomen.  CT scan performed earlier was unremarkable.  He did undergo right upper quadrant ultrasound to further evaluate for possible cholelithiasis.  This study was normal.  Patient ministered IV fluids, antiemetics with improvement.  Final Clinical Impressions(s) / ED Diagnoses   Final diagnoses:  Non-intractable vomiting with nausea, unspecified vomiting type    ED Discharge Orders  Ordered    promethazine (PHENERGAN) 25 MG tablet  Every 6 hours PRN     12/07/17 0815    promethazine (PHENERGAN) 25 MG suppository  Every 6 hours PRN     12/07/17 0815           Gilda Crease, MD 12/07/17 [phone]

## 2017-12-07 NOTE — ED Notes (Signed)
Patient given discharge teaching and verbalized understanding. Patient ambulated out of ED with a steady gait. 

## 2018-04-29 IMAGING — MR MR ABDOMEN WO/W CM MRCP
12 of 24 series · 21 of 48 positions shown · IV contrast (multihance)
Comparison: Prior CT of 12/24/2016

CLINICAL DATA: Abdominal pain.  History of pancreatitis.

EXAM:
MRI ABDOMEN WITHOUT AND WITH CONTRAST (INCLUDING MRCP)
TECHNIQUE: Multiplanar multisequence MR imaging of the abdomen was performed
both before and after the administration of intravenous contrast.
Heavily T2-weighted images of the biliary and pancreatic ducts were
obtained, and three-dimensional MRCP images were rendered by post
processing.
CONTRAST:  20mL MULTIHANCE GADOBENATE DIMEGLUMINE 529 MG/ML IV SOLN

[Series 5: T2 fat-sat · axial · 5.0mm · 0.78mm/px · z∈[-93,+122]mm · 2 of 44 slices shown]
[im 1/44]
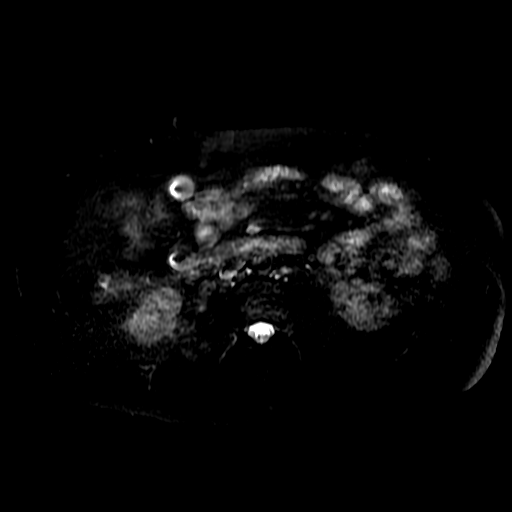
[im 44/44]
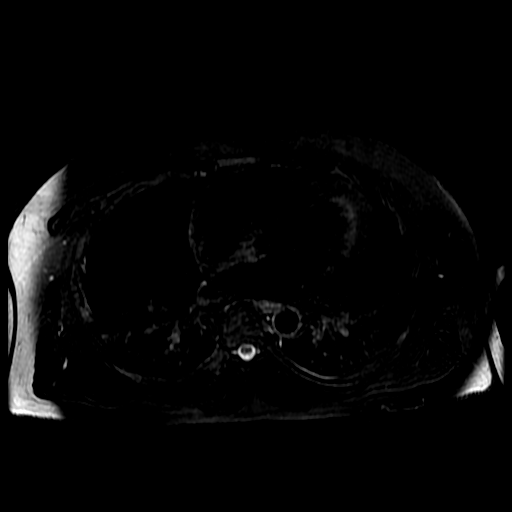

[Series 6: DWI b500 · axial · 6.0mm · 1.48mm/px · z∈[-92,+135]mm · 3 of 60 slices shown]
[im 1/60]
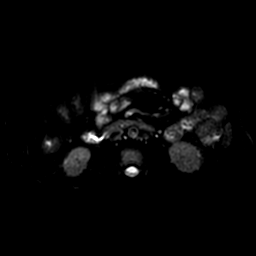
[im 30/60]
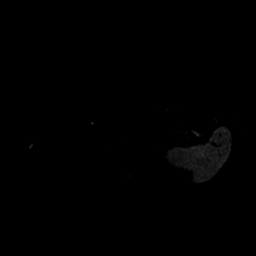
[im 60/60]
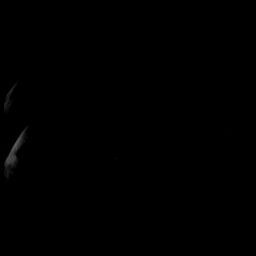

[Series 8: MRCP · coronal · 2.0mm · 0.70mm/px · 2 of 48 slices shown (1 of 2)]
[im 1/48]
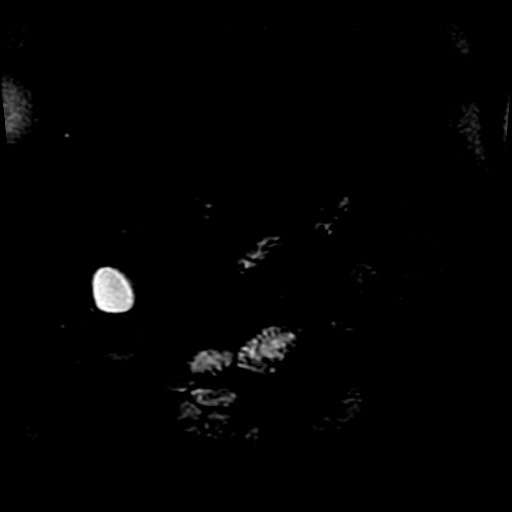
[im 48/48]
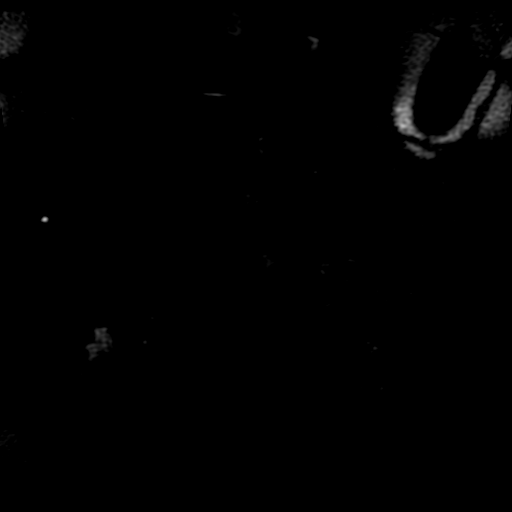

[Series 9: MRCP · sagittal · 40.0mm · 0.70mm/px · 1 of 20 slices shown (2 of 2)]
[im 1/20]
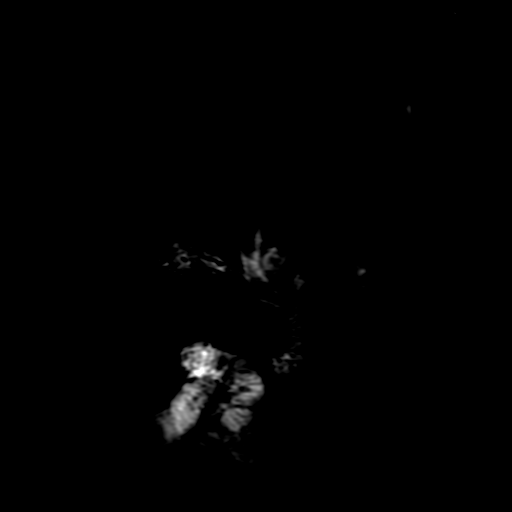

[Series 12: ax dualecho · axial · 5.0mm · 0.78mm/px · z∈[-145,+130]mm · 3 of 112 slices shown]
[im 1/112]
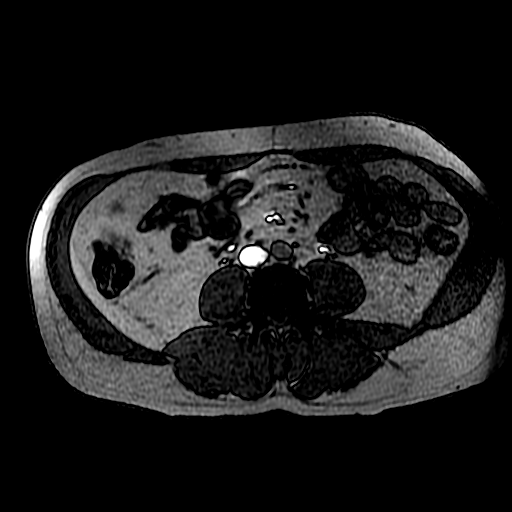
[im 56/112]
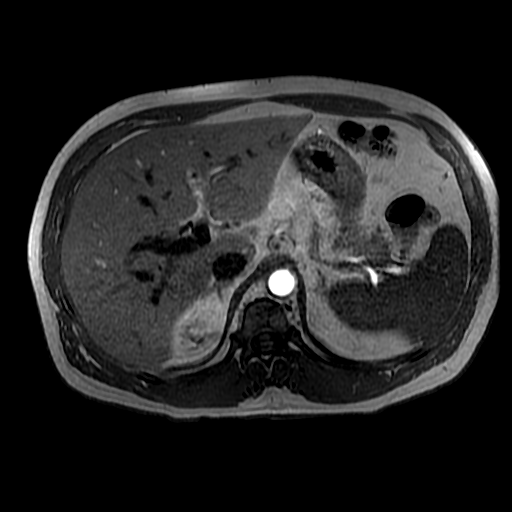
[im 112/112]
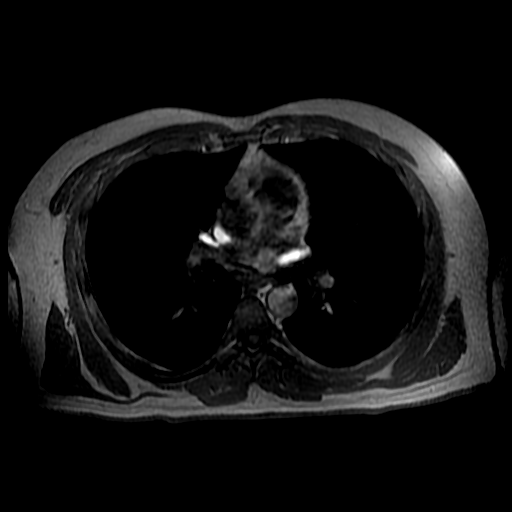

[Series 13: bSSFP fat-sat · coronal · 5.0mm · 0.78mm/px · 1 of 41 slices shown]
[im 1/41]
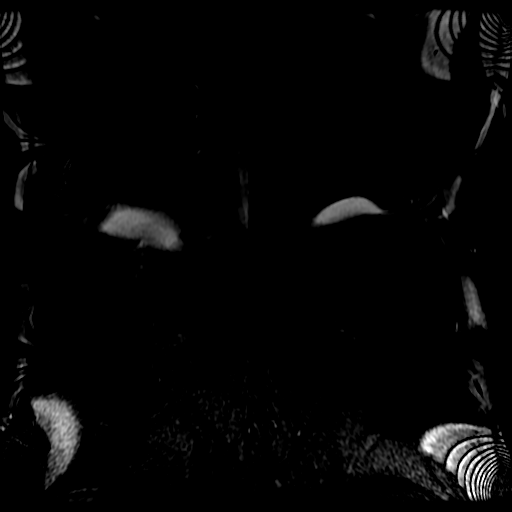

[Series 14: T2 · axial · 5.0mm · 0.78mm/px · 1 of 56 slices shown (1 of 2)]
[im 1/56]
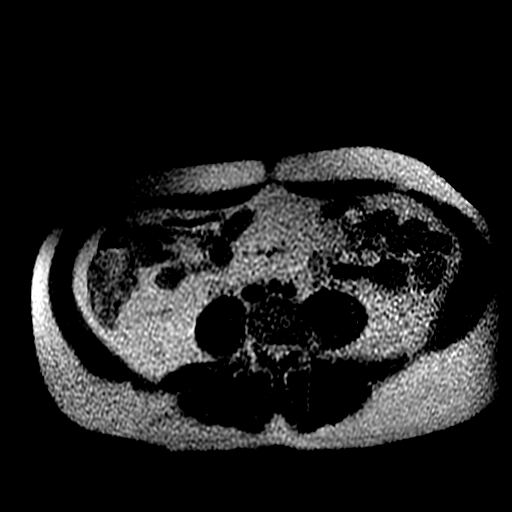

[Series 15: T2 · axial · 5.0mm · 0.78mm/px · 1 of 56 slices shown (2 of 2)]
[im 1/56]
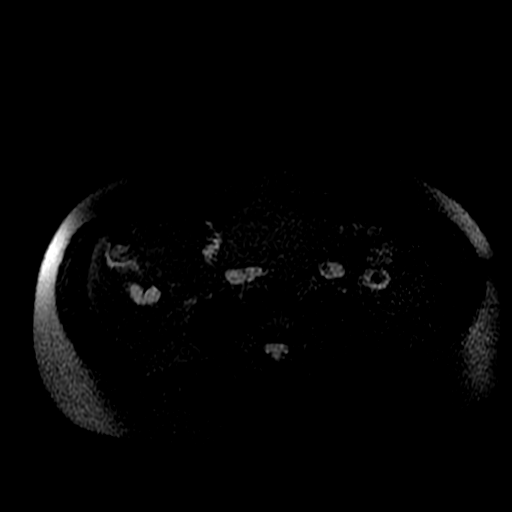

[Series 19: T1 dynamic · coronal · delayed · 4.0mm · 0.78mm/px · 3 of 96 slices shown]
[im 1/96]
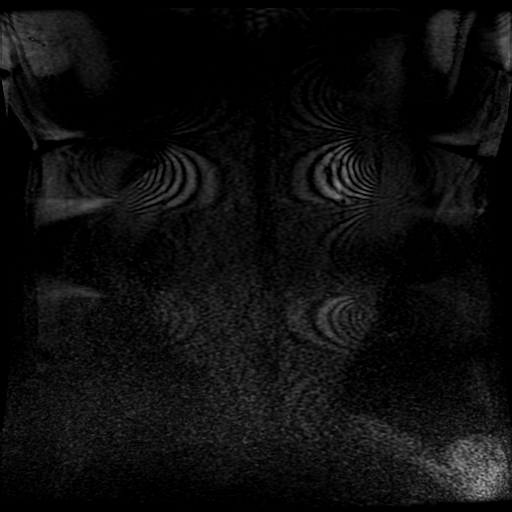
[im 48/96]
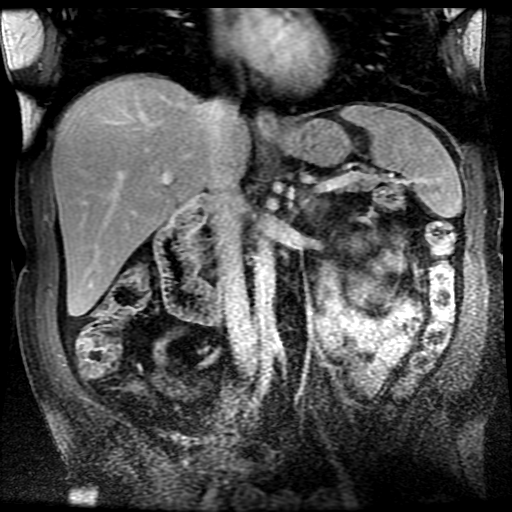
[im 96/96]
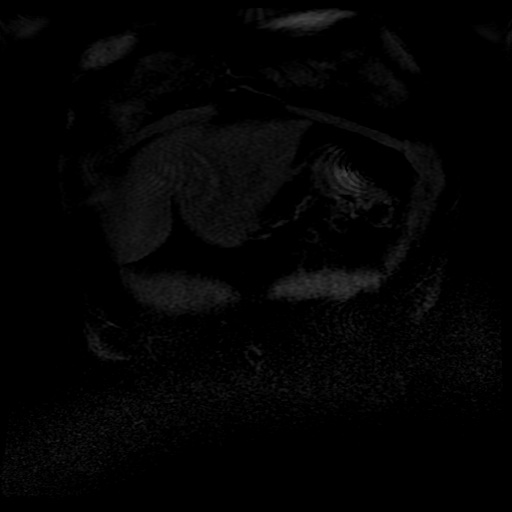

[Series 600: DWI · axial · 6.0mm · 1.48mm/px · 1 of 30 slices shown]
[im 1/30]
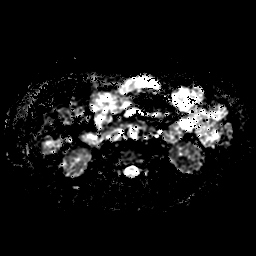

[Series 702: pjn · axial · 0.6mm · 0.62mm/px · z∈[-97,-13]mm · 2 of 89 slices shown (1 of 2)]
[im 1/89]
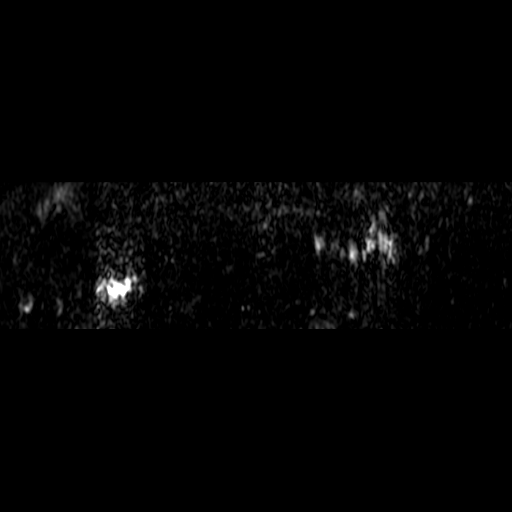
[im 89/89]
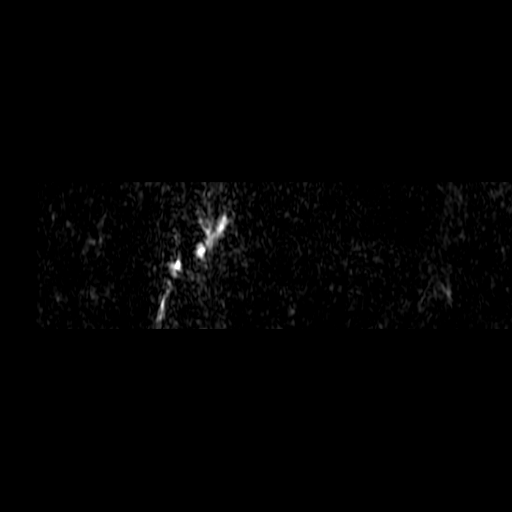

[Series 703: pjn · sagittal · 0.6mm · 0.62mm/px · 1 of 91 slices shown (2 of 2)]
[im 1/91]
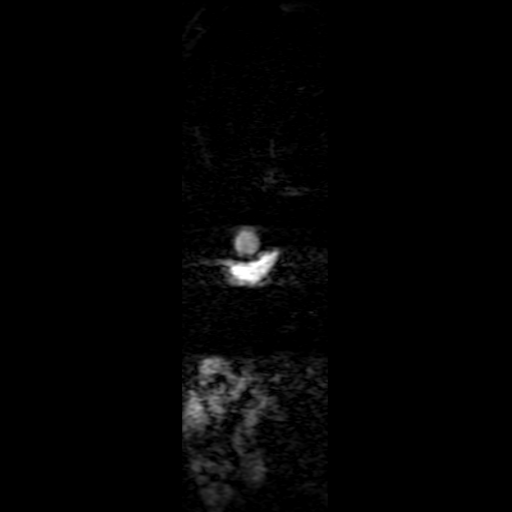

[21 of 48 positions shown; findings below may reference images not displayed]

FINDINGS: Portions of exam are minimally motion degraded.

Lower chest: Normal heart size without pericardial or pleural
effusion.

Hepatobiliary: Tiny hepatic cysts. Focal steatosis adjacent the
falciform ligament. Normal gallbladder. No intra or extrahepatic
biliary duct dilatation. Common duct normal, including at 4 mm image
57/series 7. No choledocholithiasis.

Pancreas: No evidence of acute pancreatitis. No pancreatic fluid
collection. No pancreatic duct dilatation or evidence of pancreas
divisum.

Spleen:  Normal in size, without focal abnormality.

Adrenals/Urinary Tract: Normal adrenal glands. Right renal cyst.
Bilateral too small to characterize renal lesions.

Stomach/Bowel: Normal stomach and abdominal bowel loops.

Vascular/Lymphatic: Aortic atherosclerosis. No retroperitoneal or
retrocrural adenopathy.

Other:  No ascites.

Musculoskeletal: No acute osseous abnormality.
IMPRESSION: 1. No evidence of pancreatitis, choledocholithiasis, pancreas
divisum, or other acute finding.
2.  Aortic Atherosclerosis ([id number]-35U.U).  This is age advanced.
3. Minimal motion degradation.

## 2020-05-03 ENCOUNTER — Encounter (HOSPITAL_BASED_OUTPATIENT_CLINIC_OR_DEPARTMENT_OTHER): Payer: Self-pay | Admitting: Emergency Medicine

## 2020-05-03 ENCOUNTER — Other Ambulatory Visit: Payer: Self-pay

## 2020-05-03 ENCOUNTER — Emergency Department (HOSPITAL_BASED_OUTPATIENT_CLINIC_OR_DEPARTMENT_OTHER)
Admission: EM | Admit: 2020-05-03 | Discharge: 2020-05-03 | Disposition: A | Discharge status: Home / Self Care | Payer: 59 | Attending: Emergency Medicine | Admitting: Emergency Medicine

## 2020-05-03 DIAGNOSIS — R000 Tachycardia, unspecified: Secondary | ICD-10-CM | POA: Insufficient documentation

## 2020-05-03 DIAGNOSIS — X58XXXA Exposure to other specified factors, initial encounter: Secondary | ICD-10-CM | POA: Insufficient documentation

## 2020-05-03 DIAGNOSIS — Z79899 Other long term (current) drug therapy: Secondary | ICD-10-CM | POA: Diagnosis not present

## 2020-05-03 DIAGNOSIS — R55 Syncope and collapse: Secondary | ICD-10-CM | POA: Diagnosis not present

## 2020-05-03 DIAGNOSIS — Y9289 Other specified places as the place of occurrence of the external cause: Secondary | ICD-10-CM | POA: Diagnosis not present

## 2020-05-03 DIAGNOSIS — S01512A Laceration without foreign body of oral cavity, initial encounter: Secondary | ICD-10-CM | POA: Diagnosis not present

## 2020-05-03 DIAGNOSIS — S00502A Unspecified superficial injury of oral cavity, initial encounter: Secondary | ICD-10-CM | POA: Diagnosis present

## 2020-05-03 DIAGNOSIS — Y9301 Activity, walking, marching and hiking: Secondary | ICD-10-CM | POA: Diagnosis not present

## 2020-05-03 DIAGNOSIS — Z87891 Personal history of nicotine dependence: Secondary | ICD-10-CM | POA: Diagnosis not present

## 2020-05-03 DIAGNOSIS — R7989 Other specified abnormal findings of blood chemistry: Secondary | ICD-10-CM | POA: Diagnosis not present

## 2020-05-03 LAB — BASIC METABOLIC PANEL
Anion gap: 11 (ref 5–15)
BUN: 21 mg/dL — ABNORMAL HIGH (ref 6–20)
CO2: 22 mmol/L (ref 22–32)
Calcium: 9.6 mg/dL (ref 8.9–10.3)
Chloride: 103 mmol/L (ref 98–111)
Creatinine, Ser: 1.28 mg/dL — ABNORMAL HIGH (ref 0.61–1.24)
GFR, Estimated: >60 mL/min (ref >60)
Glucose, Bld: 117 mg/dL — ABNORMAL HIGH (ref 70–99)
Potassium: 3.7 mmol/L (ref 3.5–5.1)
Sodium: 136 mmol/L (ref 135–145)

## 2020-05-03 LAB — URINALYSIS, ROUTINE W REFLEX MICROSCOPIC
Bilirubin Urine: NEGATIVE
Glucose, UA: NEGATIVE mg/dL
Hgb urine dipstick: NEGATIVE
Ketones, ur: NEGATIVE mg/dL
Leukocytes,Ua: NEGATIVE
Nitrite: NEGATIVE
Protein, ur: NEGATIVE mg/dL
Specific Gravity, Urine: >1.03 — ABNORMAL HIGH (ref 1.005–1.030)
pH: 6 (ref 5.0–8.0)

## 2020-05-03 LAB — RAPID URINE DRUG SCREEN, HOSP PERFORMED
Amphetamines: NOT DETECTED
Barbiturates: NOT DETECTED
Benzodiazepines: NOT DETECTED
Cocaine: POSITIVE — AB
Opiates: NOT DETECTED
Tetrahydrocannabinol: NOT DETECTED

## 2020-05-03 LAB — CBC
HCT: 44.6 % (ref 39.0–52.0)
Hemoglobin: 15.4 g/dL (ref 13.0–17.0)
MCH: 30.9 pg (ref 26.0–34.0)
MCHC: 34.5 g/dL (ref 30.0–36.0)
MCV: 89.6 fL (ref 80.0–100.0)
Platelets: 295 10*3/uL (ref 150–400)
RBC: 4.98 MIL/uL (ref 4.22–5.81)
RDW: 13.5 % (ref 11.5–15.5)
WBC: 13 10*3/uL — ABNORMAL HIGH (ref 4.0–10.5)
nRBC: 0 % (ref 0.0–0.2)

## 2020-05-03 LAB — CBG MONITORING, ED: Glucose-Capillary: 115 mg/dL — ABNORMAL HIGH (ref 70–99)

## 2020-05-03 LAB — ETHANOL: Alcohol, Ethyl (B): <10 mg/dL (ref <10)

## 2020-05-03 NOTE — ED Provider Notes (Signed)
[facility] EMERGENCY DEPARTMENT Provider Note   CSN: [id number] Arrival date & time: 05/03/20  1709     History Chief Complaint  Patient presents with  . 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 He states that he had a bowel movement and was getting up when he started to feel "like a panic attack" with a sensation of warmth and clamminess, then passed out. His family heard him fall from the next room. They report some episodes of twitching while he was out and note some blood in his mouth, he does have an area on his tongue that he believes he bit. He woke in a few seconds and was confused momentarily but felt normal again after a few seconds. No loss of bowel or bladder. Denies any similar symptoms previously. No history of seizures, heart disease, arrhythmia. He was in his usual state of health prior to this, denies recent illness. Feels that he is hydrating normally. He does report a prior history of alcohol use disorder which is in remission, he now drinks about 6 drinks per month and last had 1 beer last night. He reports using cocaine yesterday, denies any other drug use. He does drink energy drinks regularly but has not had any today. He travels frequently for work, most recent plane ride last week of 2 hours.  Relevant medications include Wellbutrin, Lamictal, and prn Ativan by [facility] psychiatry. No history of eating disorders. No recent medication changes or taking extra meds. Had elevated TSH in 2018, documentation at that time indicates staff were unable to reach him regarding this.   Past Medical History:  Diagnosis Date  . Acute pancreatitis   . Arthritis   . Depression   . GERD (gastroesophageal reflux disease)   . Hyperlipidemia   . Kidney stone     Patient Active Problem List   Diagnosis Date Noted  . Belching 04/06/2017  . Abdominal pain,  epigastric 04/06/2017  . Acute pancreatitis 02/20/2017  . Substance induced mood disorder (HCC) 01/04/2013  . Cocaine abuse with intoxication and without complication (HCC) 01/04/2013  . Cannabis dependence with intoxication (HCC) 01/04/2013  . Bipolar disorder, unspecified (HCC) 01/04/2013    Past Surgical History:  Procedure Laterality Date  . KIDNEY STONE REMOVAL    . KIDNEY STONE SURGERY Right 04/20/1994       Family History  Problem Relation Age of Onset  . Alcohol abuse Mother   . Diabetes Mother   . Alcohol abuse Father   . Arthritis Father   . Lung cancer Father   . Cancer Maternal Aunt   . Arthritis Maternal Grandmother   . Cancer Maternal Grandmother   . Cancer Maternal Grandfather   . Prostate cancer Maternal Grandfather   . Breast cancer Paternal Aunt   . Liver disease Paternal Uncle     Social History   Tobacco Use  . Smoking status: Former Smoker    Packs/day: 2.00    Years: 15.00    Pack years: 30.00    Types: Cigarettes    Quit date: 02/15/2017    Years since quitting: 3.2  . Smokeless tobacco: Current User  . Tobacco comment: patch  Vaping Use  . Vaping Use: Never used  Substance Use Topics  . Alcohol use: Yes    Alcohol/week: 3.0 standard drinks    Types: 3 Cans of beer per week  Comment: everyday  . Drug use: Yes    Types: Marijuana, Cocaine    Home Medications Prior to Admission medications   Medication Sig Start Date End Date Taking? Authorizing Provider  atorvastatin (LIPITOR) 40 MG tablet Take 40 mg by mouth daily. 10/28/17   [provider]  buPROPion (WELLBUTRIN XL) 150 MG 24 hr tablet Take 150 mg by mouth daily.  10/23/17   [provider]  DEXILANT 60 MG capsule Take 60 mg by mouth daily.  11/29/17   [provider]  lamoTRIgine (LAMICTAL) 25 MG tablet Take 25 mg by mouth daily.  11/23/17   [provider]  ranitidine (ZANTAC) 150 MG tablet Take 1 tablet (150 mg total) by mouth 3 (three) times  daily. Patient not taking: Reported on 12/06/2017 03/29/17   Zehr, Princella PellegriniJessica D, PA-C  simethicone (GAS-X) 80 MG chewable tablet Chew 80 mg by mouth every 6 (six) hours as needed for flatulence.    [provider]    Allergies    Doxycycline  Review of Systems   Review of Systems  Constitutional: Positive for diaphoresis. Negative for appetite change, chills and fever.  HENT: Negative for congestion and sore throat.   Respiratory: Positive for shortness of breath. Negative for cough.   Cardiovascular: Negative for chest pain, palpitations and leg swelling.  Gastrointestinal: Negative for abdominal pain, diarrhea, nausea and vomiting.  Musculoskeletal: Negative for myalgias.  Neurological: Positive for syncope. Negative for dizziness, speech difficulty, weakness and light-headedness.  Psychiatric/Behavioral: Negative for sleep disturbance.  All other systems reviewed and are negative.   Physical Exam Updated Vital Signs BP 115/71   Pulse 78   Temp 98.3 F (36.8 C) (Oral)   Resp 18   Ht 5\' 9"  (1.753 m)   Wt 102.1 kg   SpO2 97%   BMI 33.23 kg/m   Physical Exam Vitals and nursing note reviewed.  Constitutional:      General: He is not in acute distress.    Appearance: Normal appearance. He is not ill-appearing or diaphoretic.  HENT:     Head: Normocephalic and atraumatic.     Comments: Roughly 3mm laceration to left anterior tip of tongue without active bleeding    Right Ear: External ear normal.     Left Ear: External ear normal.     Nose: Nose normal.     Mouth/Throat:     Mouth: Mucous membranes are moist.  Eyes:     Extraocular Movements: Extraocular movements intact.     Conjunctiva/sclera: Conjunctivae normal.     Pupils: Pupils are equal, round, and reactive to light.  Cardiovascular:     Rate and Rhythm: Regular rhythm. Tachycardia present.     Heart sounds: No murmur heard. No friction rub. No gallop.   Pulmonary:     Effort: Pulmonary effort is  normal.     Breath sounds: Normal breath sounds. No stridor. No wheezing, rhonchi or rales.  Abdominal:     General: Abdomen is flat.  Musculoskeletal:        General: No tenderness or signs of injury. Normal range of motion.     Cervical back: Normal range of motion and neck supple.     Right lower leg: No edema.     Left lower leg: No edema.     Comments: Negative Homan's sign  Skin:    General: Skin is warm and dry.  Neurological:     General: No focal deficit present.     Mental Status: He  is alert and oriented to person, place, and time.     Cranial Nerves: No cranial nerve deficit.     Sensory: No sensory deficit.     Motor: No weakness.     Coordination: Coordination normal.     Gait: Gait normal.  Psychiatric:        Mood and Affect: Mood normal.        Behavior: Behavior normal.     ED Results / Procedures / Treatments   Labs (all labs ordered are listed, but only abnormal results are displayed) Labs Reviewed  BASIC METABOLIC PANEL - Abnormal; Notable for the following components:      Result Value   Glucose, Bld 117 (*)    BUN 21 (*)    Creatinine, Ser 1.28 (*)    All other components within normal limits  CBC - Abnormal; Notable for the following components:   WBC 13.0 (*)    All other components within normal limits  URINALYSIS, ROUTINE W REFLEX MICROSCOPIC - Abnormal; Notable for the following components:   Specific Gravity, Urine >1.030 (*)    All other components within normal limits  RAPID URINE DRUG SCREEN, HOSP PERFORMED - Abnormal; Notable for the following components:   Cocaine POSITIVE (*)    All other components within normal limits  CBG MONITORING, ED - Abnormal; Notable for the following components:   Glucose-Capillary 115 (*)    All other components within normal limits  ETHANOL  TSH    EKG EKG Interpretation  Date/Time:  Saturday May 03 2020 17:23:33 EDT Ventricular Rate:  101 PR Interval:  152 QRS Duration: 94 QT  Interval:  330 QTC Calculation: 427 R Axis:   18 Text Interpretation: Sinus tachycardia Otherwise normal ECG No previous tracing Confirmed by Gwyneth Sprout ([id number]) on 05/03/2020 6:43:13 PM  ED Course  I have reviewed the triage vital signs and the nursing notes.  Pertinent labs & imaging results that were available during my care of the patient were reviewed by me and considered in my medical decision making (see chart for details).    MDM Rules/Calculators/A&P                          Patient with history most consistent with vasovagal syndrome.  Denies chest pain, palpitations, or history of any cardiac issue.  Noted to have elevated creatinine and high specific gravity on testing so dehydration may play a role as well.  Orthostatic vital signs were normal.  Seizure unlikely with the history given.  Noted to have cocaine use energy drinks, denies any use today.  He does travel to [place], but most recent plane ride was 2 hours so unlikely to have a large VTE.  At this time, feel that he is low risk for arrhythmia or structural cardiac etiology, but return precautions were given and can investigate this further if this recurs.  Also noted to have elevated creatinine while prior was normal in 2019, has not seen his PCP in a few years because his job has been traveling most of the time during the week.  I also rechecked a TSH given previous high level which has not been followed up on.  Discussed with patient to follow-up on these with his PCP, he will try to make an appointment.  Final Clinical Impression(s) / ED Diagnoses Final diagnoses:  Vasovagal episode    Rx / DC Orders ED Discharge Orders    None  Remo Lipps, MD 05/03/20 1950    Gwyneth Sprout, MD 05/07/20 [phone]

## 2020-05-03 NOTE — ED Notes (Signed)
Pt is laying flat for 10 minutes currently for orthostatic vitals

## 2020-05-03 NOTE — ED Notes (Signed)
EDP at bedside  

## 2020-05-03 NOTE — ED Notes (Signed)
ED Provider at bedside. 

## 2020-05-03 NOTE — Discharge Instructions (Addendum)
Randy Neal, it was a pleasure taking care of you. Your testing here was largely reassuring, and I think your symptoms are most likely caused by vasovagal syndrome. I have lower suspicion for a heart problem, an arrhythmia, or a seizure but these would be things we want to explore further if you have this type of episode again.  There are a few things on our testing that are unrelated to your current problem, but that you should follow up with a primary care doctor. Your kidney function is mildly impaired compared to your last testing in 2019. Creatinine was 1.28. You should have repeat testing to see if this improves or if this your new baseline. I also checked your thyroid function, which was mildly low in 2018. I will call with the result of this, and may also need to followed up by your PCP.

## 2020-05-03 NOTE — ED Triage Notes (Signed)
Reports he was walking across the floor after having a bowel movement and passed out.  Witness by his mother.  She reports he was twitching and he did bite his tongue.  No incontinence.  Does not have a history of seizures.

## 2020-05-04 LAB — TSH: TSH: 4.188 u[IU]/mL (ref 0.350–4.500)

## 2021-06-30 ENCOUNTER — Other Ambulatory Visit: Payer: Self-pay

## 2021-06-30 ENCOUNTER — Emergency Department (HOSPITAL_BASED_OUTPATIENT_CLINIC_OR_DEPARTMENT_OTHER)
Admission: EM | Admit: 2021-06-30 | Discharge: 2021-06-30 | Disposition: A | Discharge status: Home / Self Care | Payer: Self-pay | Attending: Emergency Medicine | Admitting: Emergency Medicine

## 2021-06-30 ENCOUNTER — Emergency Department (HOSPITAL_BASED_OUTPATIENT_CLINIC_OR_DEPARTMENT_OTHER): Payer: Self-pay

## 2021-06-30 ENCOUNTER — Encounter (HOSPITAL_BASED_OUTPATIENT_CLINIC_OR_DEPARTMENT_OTHER): Payer: Self-pay | Admitting: Emergency Medicine

## 2021-06-30 DIAGNOSIS — A084 Viral intestinal infection, unspecified: Secondary | ICD-10-CM | POA: Insufficient documentation

## 2021-06-30 DIAGNOSIS — R638 Other symptoms and signs concerning food and fluid intake: Secondary | ICD-10-CM | POA: Insufficient documentation

## 2021-06-30 DIAGNOSIS — R1084 Generalized abdominal pain: Secondary | ICD-10-CM | POA: Insufficient documentation

## 2021-06-30 LAB — CBC WITH DIFFERENTIAL/PLATELET
Abs Immature Granulocytes: 0.04 10*3/uL (ref 0.00–0.07)
Basophils Absolute: 0.1 10*3/uL (ref 0.0–0.1)
Basophils Relative: 1 %
Eosinophils Absolute: 0.2 10*3/uL (ref 0.0–0.5)
Eosinophils Relative: 2 %
HCT: 40.9 % (ref 39.0–52.0)
Hemoglobin: 14 g/dL (ref 13.0–17.0)
Immature Granulocytes: 0 %
Lymphocytes Relative: 18 %
Lymphs Abs: 1.9 10*3/uL (ref 0.7–4.0)
MCH: 30.6 pg (ref 26.0–34.0)
MCHC: 34.2 g/dL (ref 30.0–36.0)
MCV: 89.5 fL (ref 80.0–100.0)
Monocytes Absolute: 0.4 10*3/uL (ref 0.1–1.0)
Monocytes Relative: 4 %
Neutro Abs: 7.7 10*3/uL (ref 1.7–7.7)
Neutrophils Relative %: 75 %
Platelets: 274 10*3/uL (ref 150–400)
RBC: 4.57 MIL/uL (ref 4.22–5.81)
RDW: 12.8 % (ref 11.5–15.5)
WBC: 10.3 10*3/uL (ref 4.0–10.5)
nRBC: 0 % (ref 0.0–0.2)

## 2021-06-30 LAB — COMPREHENSIVE METABOLIC PANEL
ALT: 22 U/L (ref 0–44)
AST: 15 U/L (ref 15–41)
Albumin: 3.7 g/dL (ref 3.5–5.0)
Alkaline Phosphatase: 77 U/L (ref 38–126)
Anion gap: 7 (ref 5–15)
BUN: 11 mg/dL (ref 6–20)
CO2: 26 mmol/L (ref 22–32)
Calcium: 9.2 mg/dL (ref 8.9–10.3)
Chloride: 104 mmol/L (ref 98–111)
Creatinine, Ser: 1.04 mg/dL (ref 0.61–1.24)
GFR, Estimated: >60 mL/min (ref >60)
Glucose, Bld: 135 mg/dL — ABNORMAL HIGH (ref 70–99)
Potassium: 3.6 mmol/L (ref 3.5–5.1)
Sodium: 137 mmol/L (ref 135–145)
Total Bilirubin: 0.6 mg/dL (ref 0.3–1.2)
Total Protein: 6.5 g/dL (ref 6.5–8.1)

## 2021-06-30 LAB — LIPASE, BLOOD: Lipase: 26 U/L (ref 11–51)

## 2021-06-30 LAB — URINALYSIS, ROUTINE W REFLEX MICROSCOPIC
Bilirubin Urine: NEGATIVE
Glucose, UA: NEGATIVE mg/dL
Hgb urine dipstick: NEGATIVE
Ketones, ur: NEGATIVE mg/dL
Leukocytes,Ua: NEGATIVE
Nitrite: NEGATIVE
Protein, ur: NEGATIVE mg/dL
Specific Gravity, Urine: 1.025 (ref 1.005–1.030)
pH: 6 (ref 5.0–8.0)

## 2021-06-30 LAB — TSH: TSH: 5.194 u[IU]/mL — ABNORMAL HIGH (ref 0.350–4.500)

## 2021-06-30 MED ORDER — ONDANSETRON 4 MG PO TBDP: 4.0000 mg | ORAL_TABLET | Freq: Three times a day (TID) | ORAL | 0 refills | Status: DC | PRN

## 2021-06-30 MED ORDER — DICYCLOMINE HCL 10 MG PO CAPS
10.0000 mg | ORAL_CAPSULE | Freq: Once | ORAL | Status: AC
  Administered 2021-06-30: 10 mg via ORAL
  Filled 2021-06-30: qty 1

## 2021-06-30 MED ORDER — SODIUM CHLORIDE 0.9 % IV BOLUS
1000.0000 mL | Freq: Once | INTRAVENOUS | Status: AC
  Administered 2021-06-30: 1000 mL via INTRAVENOUS

## 2021-06-30 MED ORDER — DICYCLOMINE HCL 20 MG PO TABS: 20.0000 mg | ORAL_TABLET | Freq: Two times a day (BID) | ORAL | 0 refills | Status: AC

## 2021-06-30 MED ORDER — LOPERAMIDE HCL 2 MG PO CAPS: 2.0000 mg | ORAL_CAPSULE | Freq: Four times a day (QID) | ORAL | 0 refills | Status: AC | PRN

## 2021-06-30 MED ORDER — IOHEXOL 300 MG/ML  SOLN
100.0000 mL | Freq: Once | INTRAMUSCULAR | Status: AC | PRN
  Administered 2021-06-30: 100 mL via INTRAVENOUS

## 2021-06-30 MED ORDER — DICYCLOMINE HCL 20 MG PO TABS: 20.0000 mg | ORAL_TABLET | Freq: Two times a day (BID) | ORAL | 0 refills | Status: DC

## 2021-06-30 MED ORDER — ONDANSETRON 4 MG PO TBDP: 4.0000 mg | ORAL_TABLET | Freq: Three times a day (TID) | ORAL | 0 refills | Status: AC | PRN

## 2021-06-30 NOTE — ED Notes (Signed)
Thursday last PM began having increase in abd pain, cramping type pain and then a lot of flatus and diarrhea, states immediately after eating flatus and diarrhea are worse ?

## 2021-06-30 NOTE — ED Notes (Signed)
Pt appears uncomfortable and tired in bed, a/ox4. Pt c/o watery diarrhea x 5 days. Denies n/v or fever. ABD cramping prior to BMs ?

## 2021-06-30 NOTE — ED Notes (Signed)
Pt NAD, a/ox4. Pt verbalizes understanding of all DC and f/u instructions. All questions answered. Pt walks with steady gait to lobby at DC.  ? ?

## 2021-06-30 NOTE — ED Triage Notes (Signed)
Pt c/o diarrhea and intermittent sharp lower abdominal pain since Thursday. Pain worsens after eating. 1 vomiting episode yesterday.  ?

## 2021-06-30 NOTE — ED Provider Notes (Signed)
?[facility] EMERGENCY DEPARTMENT ?Provider Note ? ? ?CSN: [id number] ?Arrival date & time: 06/30/21  1302 ? ?  ? ?History ? ?Chief Complaint  ?Patient presents with  ? Diarrhea  ? Abdominal Pain  ? ? ?Randy Neal is a 46 y.o. male. ? ?HPI ?46 year old male with a history of cocaine abuse, bipolar disorder, renal stones, GERD, acute pancreatitis, hyperlipidemia is to the ER with complaints of 4 days of lower abdominal pain and watery diarrhea.  Patient reports anytime he drinks water or tries to eat any food he has profuse watery diarrhea.  He has had no recent travel, no recent antibiotics, no new foods, no incident or water exposures.  He does report living at [facility] and has been eating the food provided there.  He had 1 episode of nonbloody nonbilious emesis yesterday.  No known fevers or chills.  Denies any urinary symptoms.  No flank pain.  ?  ? ?Home Medications ?Prior to Admission medications   ?Medication Sig Start Date End Date Taking? Authorizing Provider  ?dicyclomine (BENTYL) 20 MG tablet Take 1 tablet (20 mg total) by mouth 2 (two) times daily for 7 days. 06/30/21 07/07/21 Yes Mare Ferrari, PA-C  ?ondansetron (ZOFRAN-ODT) 4 MG disintegrating tablet Take 1 tablet (4 mg total) by mouth every 8 (eight) hours as needed for nausea or vomiting. 06/30/21  Yes Mare Ferrari, PA-C  ?atorvastatin (LIPITOR) 40 MG tablet Take 40 mg by mouth daily. 10/28/17   [provider]  ?buPROPion (WELLBUTRIN XL) 150 MG 24 hr tablet Take 150 mg by mouth daily.  10/23/17   [provider]  ?DEXILANT 60 MG capsule Take 60 mg by mouth daily.  11/29/17   [provider]  ?lamoTRIgine (LAMICTAL) 25 MG tablet Take 25 mg by mouth daily.  11/23/17   [provider]  ?ranitidine (ZANTAC) 150 MG tablet Take 1 tablet (150 mg total) by mouth 3 (three) times daily. ?Patient not taking: Reported on 12/06/2017 03/29/17   Zehr, Princella Pellegrini, PA-C  ?simethicone (GAS-X) 80 MG chewable tablet  Chew 80 mg by mouth every 6 (six) hours as needed for flatulence.    [provider]  ?   ? ?Allergies    ?Doxycycline   ? ?Review of Systems   ?Review of Systems ?Ten systems reviewed and are negative for acute change, except as noted in the HPI.  ? ?Physical Exam ?Updated Vital Signs ?BP 126/78 (BP Location: Left Arm)   Pulse 62   Temp 98 ?F (36.7 ?C) (Oral)   Resp 16   Wt 80.3 kg   SpO2 100%   BMI 26.14 kg/m?  ?Physical Exam ?Vitals and nursing note reviewed.  ?Constitutional:   ?   General: He is not in acute distress. ?   Appearance: He is well-developed.  ?HENT:  ?   Head: Normocephalic and atraumatic.  ?Eyes:  ?   Conjunctiva/sclera: Conjunctivae normal.  ?Cardiovascular:  ?   Rate and Rhythm: Normal rate and regular rhythm.  ?   Heart sounds: No murmur heard. ?Pulmonary:  ?   Effort: Pulmonary effort is normal. No respiratory distress.  ?   Breath sounds: Normal breath sounds.  ?Abdominal:  ?   Palpations: Abdomen is soft.  ?   Tenderness: There is abdominal tenderness.  ?   Comments: Mild generalized tenderness  ?Musculoskeletal:     ?   General: No swelling.  ?   Cervical back: Neck supple.  ?Skin: ?   General: Skin  is warm and dry.  ?   Capillary Refill: Capillary refill takes less than 2 seconds.  ?Neurological:  ?   Mental Status: He is alert.  ?Psychiatric:     ?   Mood and Affect: Mood normal.  ? ? ?ED Results / Procedures / Treatments   ?Labs ?(all labs ordered are listed, but only abnormal results are displayed) ?Labs Reviewed  ?COMPREHENSIVE METABOLIC PANEL - Abnormal; Notable for the following components:  ?    Result Value  ? Glucose, Bld 135 (*)   ? All other components within normal limits  ?GASTROINTESTINAL PANEL BY PCR, STOOL (REPLACES STOOL CULTURE)  ?C DIFFICILE QUICK SCREEN W PCR REFLEX    ?CBC WITH DIFFERENTIAL/PLATELET  ?LIPASE, BLOOD  ?URINALYSIS, ROUTINE W REFLEX MICROSCOPIC  ?TSH  ?T3, FREE  ? ? ?EKG ?None ? ?Radiology ?CT ABDOMEN PELVIS W CONTRAST ? ?Result Date:  06/30/2021 ?CLINICAL DATA:  Abdominal pain EXAM: CT ABDOMEN AND PELVIS WITH CONTRAST TECHNIQUE: Multidetector CT imaging of the abdomen and pelvis was performed using the standard protocol following bolus administration of intravenous contrast. RADIATION DOSE REDUCTION: This exam was performed according to the departmental dose-optimization program which includes automated exposure control, adjustment of the mA and/or kV according to patient size and/or use of iterative reconstruction technique. CONTRAST:  OMNIPAQUE IOHEXOL 300 MG/ML  SOLN COMPARISON:  12/06/2017 and previous FINDINGS: Lower chest: No pleural or pericardial effusion. Hepatobiliary: No focal liver abnormality is seen. No gallstones, gallbladder wall thickening, or biliary dilatation. Pancreas: Unremarkable. No pancreatic ductal dilatation or surrounding inflammatory changes. Spleen: Normal in size without focal abnormality. Adrenals/Urinary Tract: No adrenal mass. Symmetric bilateral renal enhancement. No hydronephrosis. Urinary bladder is physiologically distended. There is linear coarse calcification along the posterior right wall of the urinary bladder, new since previous exam. Left posterolateral bladder diverticulum containing 4 mm calculus. Stomach/Bowel: Stomach is partially distended by ingested material. The small bowel is nondilated. Normal appendix. The colon is nondilated, unremarkable. Vascular/Lymphatic: Moderate calcified aortoiliac atheromatous plaque without aneurysm or evident stenosis. No abdominal or pelvic adenopathy localized. Portal vein patent. Reproductive: Mild prostate enlargement with central coarse calcifications. Other: No ascites.  No free air. Musculoskeletal: No acute or significant osseous findings. IMPRESSION: 1. No acute findings. 2. Coarse urothelial calcifications in the posterior right urinary bladder. Consider elective outpatient urologic consultation to exclude neoplasm. 3.  Aortic Atherosclerosis  (ICD10-170.0). Electronically Signed   By: Corlis Leak M.D.   On: 06/30/2021 14:44   ? ?Procedures ?Procedures  ? ? ?Medications Ordered in ED ?Medications  ?sodium chloride 0.9 % bolus 1,000 mL (0 mLs Intravenous Stopped 06/30/21 1527)  ?iohexol (OMNIPAQUE) 300 MG/ML solution 100 mL (100 mLs Intravenous Contrast Given 06/30/21 1420)  ?dicyclomine (BENTYL) capsule 10 mg (10 mg Oral Given 06/30/21 1505)  ?sodium chloride 0.9 % bolus 1,000 mL (1,000 mLs Intravenous New Bag/Given 06/30/21 1527)  ? ? ?ED Course/ Medical Decision Making/ A&P ?Clinical Course as of 06/30/21 1644  ?Tue Jun 30, 2021  ?1449 CT ABDOMEN PELVIS W CONTRAST ?Coarse urothelial calcifications in the posterior right urinary ?bladder. Consider elective outpatient urologic consultation to ?exclude neoplasm. ? ? [MB]  ?  ?Clinical Course User Index ?[MB] Mare Ferrari, PA-C  ? ?                        ?Medical Decision Making ?Amount and/or Complexity of Data Reviewed ?Labs: ordered. ?Radiology: ordered. Decision-making details documented in ED Course. ? ?Risk ?Prescription drug management. ? ? ?  This patient presents to the ED for concern of nausea, vomiting, diarrhea and abdominal pain, this involves a number of treatment options, and is a complaint that carries with it a risk of complications and morbidity.  The differential diagnosis includes diverticulitis, appendicitis, cholecystitis, small bowel obstruction/partial bowel obstruction/ileus, viral gastroenteritis ? ? ?Co morbidities: ?Discussed in HPI ? ? ?Brief History: ? ?46 year old male with nausea, vomiting and diarrhea over the last 4 days.   ? ?EMR reviewed including pt PMHx, past surgical history and past visits to ER.  ? ?See HPI for more details ? ? ?Lab Tests: ? ?I ordered and independently interpreted labs.  The pertinent results include:   ? ?Labs notable for ?CBC without leukocytosis, normal hemoglobin ?CMP without any electrode abnormalities, normal renal function, normal LFTs and  bilirubin ?UA without evidence of UTI ?Lipase normal ? ?Imaging Studies: ? ?Abnormal findings. I personally reviewed all imaging studies. Imaging notable for ? ?Coarse urothelial calcifications in the posterior right urinary

## 2021-06-30 NOTE — ED Notes (Signed)
Pt up to restroom to attempt to give stool sample ?

## 2021-06-30 NOTE — Discharge Instructions (Addendum)
You were evaluated in the Emergency Department and after careful evaluation, we did not find any emergent condition requiring admission or further testing in the hospital. ? ?Your CT did show some changes in your bladder and radiology is recommending you follow-up with a urologist to rule out cancer.  Please call the phone number of the physician that I provided in your discharge paperwork to schedule an appointment.  Advance your diet slowly and start eating foods consistent with a bland diet, see handout for more information.  Take Zofran as needed for nausea, Bentyl for abdominal pain.  Please make sure to drink plenty of fluids. Take Imodium for diarrhea.  ? ?Please return to the Emergency Department if you experience any worsening of your condition.  We encourage you to follow up with a primary care provider.  Thank you for allowing [place] to be a part of your care.   ?

## 2021-07-01 LAB — T3, FREE: T3, Free: 2.3 pg/mL (ref 2.0–4.4)

## 2022-06-28 ENCOUNTER — Emergency Department (HOSPITAL_BASED_OUTPATIENT_CLINIC_OR_DEPARTMENT_OTHER)
Admission: EM | Admit: 2022-06-28 | Discharge: 2022-06-28 | Disposition: A | Discharge status: Home / Self Care | Payer: Self-pay | Attending: Emergency Medicine | Admitting: Emergency Medicine

## 2022-06-28 ENCOUNTER — Encounter (HOSPITAL_BASED_OUTPATIENT_CLINIC_OR_DEPARTMENT_OTHER): Payer: Self-pay | Admitting: Urology

## 2022-06-28 DIAGNOSIS — L03213 Periorbital cellulitis: Secondary | ICD-10-CM

## 2022-06-28 DIAGNOSIS — X110XXA Contact with hot water in bath or tub, initial encounter: Secondary | ICD-10-CM | POA: Insufficient documentation

## 2022-06-28 DIAGNOSIS — S0502XA Injury of conjunctiva and corneal abrasion without foreign body, left eye, initial encounter: Secondary | ICD-10-CM

## 2022-06-28 DIAGNOSIS — K029 Dental caries, unspecified: Secondary | ICD-10-CM

## 2022-06-28 MED ORDER — TETRACAINE HCL 0.5 % OP SOLN
2.0000 [drp] | Freq: Once | OPHTHALMIC | Status: AC
  Administered 2022-06-28: 2 [drp] via OPHTHALMIC
  Filled 2022-06-28: qty 4

## 2022-06-28 MED ORDER — FLUORESCEIN SODIUM 1 MG OP STRP
1.0000 | ORAL_STRIP | Freq: Once | OPHTHALMIC | Status: AC
  Administered 2022-06-28: 1 via OPHTHALMIC
  Filled 2022-06-28: qty 1

## 2022-06-28 MED ORDER — CLINDAMYCIN HCL 300 MG PO CAPS: 300.0000 mg | ORAL_CAPSULE | Freq: Three times a day (TID) | ORAL | 0 refills | Status: AC

## 2022-06-28 MED ORDER — ERYTHROMYCIN 5 MG/GM OP OINT: TOPICAL_OINTMENT | OPHTHALMIC | 0 refills | Status: AC

## 2022-06-28 NOTE — Discharge Instructions (Addendum)
It was a pleasure taking care of you today!  You will be sent a prescription for erythromycin ointment, take as directed. You be provided information for the on-call ophthalmologist, call and set up an appointment regarding today's ED visit. You will also be sent a prescription for clindamycin to treat for your eye as well as your teeth.  Ensure that you complete the entire course of this antibiotic.  Is important that you also take probiotics while taking this medication you may also consume Activia yogurt while taking this medication.  Ensure to maintain fluid intake.  Attached is information for the on-call dentist, as well as resource guide for dentists in the area, you may call and set up a follow-up appointment during today's ED visit.  You may follow-up with your primary care provider as needed.  Return to emergency department for experience increasing/worsening fever, eye swelling, pain, vision changes, trouble breathing, trouble swallowing, worsening symptoms.

## 2022-06-28 NOTE — ED Triage Notes (Signed)
Pt states yesterday was cutting fiberglass States left eye swelling, doesn't feel like there is anything in it  Left upper gum pain and bottom lip pain  Concern for possible tooth problem as well   Took 2 benadryl PTA at 0900

## 2022-06-28 NOTE — ED Provider Notes (Signed)
[place] EMERGENCY DEPARTMENT AT [facility] Provider Note   CSN: [id number] Arrival date & time: 06/28/22  0941     History  Chief Complaint  Patient presents with   Eye Problem   Dental Pain    Randy Neal is a 47 y.o. male who presents emergency department with concerns for left eye swelling onset this morning.  Denies any new medications or new products at this time.  Notes that he was cutting of fiberglass hot tub without eye protection yesterday prior to onset of symptoms.  Did not feel anything go into his eye at the time.  Patient took Benadryl at home prior to arrival.  Denies redness, vision changes, itchy, watery, discharge from the eye.  Patient also with concerns for left upper gum pain onset this morning.  Denies drainage, trouble swallowing, fever.  Does not have a dentist at this time.  Patient is allergic to doxycycline.   The history is provided by the patient. No language interpreter was used.       Home Medications Prior to Admission medications   Medication Sig Start Date End Date Taking? Authorizing Provider  clindamycin (CLEOCIN) 300 MG capsule Take 1 capsule (300 mg total) by mouth 3 (three) times daily for 7 days. 06/28/22 07/05/22 Yes Meta Kroenke A, PA-C  erythromycin ophthalmic ointment Place a 1/2 inch ribbon of ointment into the left lower eyelid four (4) times daily for fiver (5) days. 06/28/22  Yes Sole Lengacher A, PA-C  atorvastatin (LIPITOR) 40 MG tablet Take 40 mg by mouth daily. 10/28/17   [provider]  buPROPion (WELLBUTRIN XL) 150 MG 24 hr tablet Take 150 mg by mouth daily.  10/23/17   [provider]  DEXILANT 60 MG capsule Take 60 mg by mouth daily.  11/29/17   [provider]  dicyclomine (BENTYL) 20 MG tablet Take 1 tablet (20 mg total) by mouth 2 (two) times daily for 7 days. 06/30/21 07/07/21  Mare Ferrari, PA-C  lamoTRIgine (LAMICTAL) 25 MG tablet Take 25 mg by mouth daily.  11/23/17    [provider]  loperamide (IMODIUM) 2 MG capsule Take 1 capsule (2 mg total) by mouth 4 (four) times daily as needed for diarrhea or loose stools. 06/30/21   Mare Ferrari, PA-C  ondansetron (ZOFRAN-ODT) 4 MG disintegrating tablet Take 1 tablet (4 mg total) by mouth every 8 (eight) hours as needed for nausea or vomiting. 06/30/21   Mare Ferrari, PA-C  ranitidine (ZANTAC) 150 MG tablet Take 1 tablet (150 mg total) by mouth 3 (three) times daily. Patient not taking: Reported on 12/06/2017 03/29/17   Zehr, Princella Pellegrini, PA-C  simethicone (GAS-X) 80 MG chewable tablet Chew 80 mg by mouth every 6 (six) hours as needed for flatulence.    [provider]      Allergies    Doxycycline    Review of Systems   Review of Systems  All other systems reviewed and are negative.   Physical Exam Updated Vital Signs BP 137/79 (BP Location: Left Arm)   Pulse 87   Temp 98.5 F (36.9 C)   Resp 18   Ht 5\' 9"  (1.753 m)   Wt 81.6 kg   SpO2 97%   BMI 26.58 kg/m  Physical Exam Vitals and nursing note reviewed.  Constitutional:      General: He is not in acute distress.    Appearance: Normal appearance. He is not ill-appearing.  HENT:  Head: Normocephalic and atraumatic.     Right Ear: External ear normal.     Left Ear: External ear normal.     Nose: Nose normal. No congestion or rhinorrhea.     Mouth/Throat:     Mouth: Mucous membranes are moist. Mucous membranes are not dry.     Dentition: Abnormal dentition. Dental tenderness and dental caries present.     Tongue: Tongue does not deviate from midline.     Pharynx: Oropharynx is clear. Uvula midline. No oropharyngeal exudate or posterior oropharyngeal erythema.     Tonsils: No tonsillar exudate or tonsillar abscesses.     Comments: Multiple dental caries noted throughout. Tenderness to palpation to left upper gum line. No fluctuance noted. No trismus. No retropharyngeal abscess. No peritonsillar abscess noted. Uvula midline.  Able to speak in clear complete sentences. Tolerating PO secretions.  Eyes:     General: Lids are everted, no foreign bodies appreciated. Vision grossly intact. No visual field deficit or scleral icterus.       Right eye: No foreign body or discharge.        Left eye: No foreign body or discharge.     Extraocular Movements: Extraocular movements intact.     Conjunctiva/sclera: Conjunctivae normal.     Pupils: Pupils are equal, round, and reactive to light.     Comments: Mild edema and erythema noted to left upper eyelid without significant TTP. PERRL. EOMI. No difficulty with EOM. No appreciable fluorescein uptake.  Cardiovascular:     Rate and Rhythm: Normal rate and regular rhythm.     Pulses: Normal pulses.     Heart sounds: Normal heart sounds.  Pulmonary:     Effort: Pulmonary effort is normal. No respiratory distress.     Breath sounds: Normal breath sounds.  Abdominal:     General: Bowel sounds are normal. There is no distension.     Palpations: Abdomen is soft. There is no mass.     Tenderness: There is no abdominal tenderness.  Musculoskeletal:        General: Normal range of motion.     Cervical back: Neck supple.  Lymphadenopathy:     Head:     Right side of head: No submental, submandibular, tonsillar, preauricular or posterior auricular adenopathy.     Left side of head: No submental, submandibular, tonsillar, preauricular or posterior auricular adenopathy.     Cervical: No cervical adenopathy.  Skin:    General: Skin is warm and dry.     Findings: No rash.  Neurological:     Mental Status: He is alert.     Sensory: Sensation is intact.     Motor: Motor function is intact.  Psychiatric:        Behavior: Behavior normal.     ED Results / Procedures / Treatments   Labs (all labs ordered are listed, but only abnormal results are displayed) Labs Reviewed - No data to display  EKG None  Radiology No results found.  Procedures Procedures    Medications  Ordered in ED Medications  fluorescein ophthalmic strip 1 strip (1 strip Left Eye Given 06/28/22 1059)  tetracaine (PONTOCAINE) 0.5 % ophthalmic solution 2 drop (2 drops Left Eye Given 06/28/22 1059)    ED Course/ Medical Decision Making/ A&P                             Medical Decision Making Risk Prescription drug management.   Pt  presents with concerns for eye swelling and left upper dental pain this morning. Vital signs, patient afebrile. On exam, pt with Mild edema and erythema noted to left upper eyelid without significant TTP. PERRL. EOMI. No difficulty with EOM. No appreciable fluorescein uptake. Multiple dental caries noted throughout. Tenderness to palpation to left upper gum line. No fluctuance noted. No trismus. No retropharyngeal abscess. No peritonsillar abscess noted. Uvula midline. Able to speak in clear complete sentences. Tolerating PO secretions. No acute cardiovascular, respiratory, abdominal exam findings. Differential diagnosis includes preseptal cellulitis, septal cellulitis, dentalgia, dental abscess, stye, hordeolum.    Disposition: Patient presenting suspicious for preseptal cellulitis of left eye, corneal abrasion, pain due to dental caries.  Doubt concerns at this time for stye or hordeolum or septal cellulitis. After consideration of the diagnostic results and the patients response to treatment, I feel that the patient would benefit from Discharge home.  Patient provided prescription for clindamycin as well as erythromycin ointment.  Patient provided with information for on-call ophthalmologist as well as on-call dentist for follow-up regarding today's ED visit.  Dental resource guide provided to patient today.  Supportive care measures and strict return precautions discussed with patient at bedside. Pt acknowledges and verbalizes understanding. Pt appears safe for discharge. Follow up as indicated in discharge paperwork.    This chart was dictated using voice  recognition software, Dragon. Despite the best efforts of this provider to proofread and correct errors, errors may still occur which can change documentation meaning.   Final Clinical Impression(s) / ED Diagnoses Final diagnoses:  Pain due to dental caries  Preseptal cellulitis of left eye  Abrasion of left cornea, initial encounter    Rx / DC Orders ED Discharge Orders          Ordered    clindamycin (CLEOCIN) 300 MG capsule  3 times daily        06/28/22 1133    erythromycin ophthalmic ointment        06/28/22 1133              Fannie Alomar A, PA-C 06/28/22 1800    Melene Plan, DO 06/29/22 0830

## 2022-12-06 ENCOUNTER — Emergency Department (HOSPITAL_BASED_OUTPATIENT_CLINIC_OR_DEPARTMENT_OTHER)
Admission: EM | Admit: 2022-12-06 | Discharge: 2022-12-06 | Disposition: A | Discharge status: Home / Self Care | Payer: MEDICAID | Attending: Emergency Medicine | Admitting: Emergency Medicine

## 2022-12-06 ENCOUNTER — Encounter (HOSPITAL_BASED_OUTPATIENT_CLINIC_OR_DEPARTMENT_OTHER): Payer: Self-pay | Admitting: Emergency Medicine

## 2022-12-06 ENCOUNTER — Emergency Department (HOSPITAL_BASED_OUTPATIENT_CLINIC_OR_DEPARTMENT_OTHER): Payer: MEDICAID

## 2022-12-06 ENCOUNTER — Other Ambulatory Visit: Payer: Self-pay

## 2022-12-06 DIAGNOSIS — R112 Nausea with vomiting, unspecified: Secondary | ICD-10-CM | POA: Insufficient documentation

## 2022-12-06 DIAGNOSIS — R109 Unspecified abdominal pain: Secondary | ICD-10-CM | POA: Insufficient documentation

## 2022-12-06 DIAGNOSIS — K5900 Constipation, unspecified: Secondary | ICD-10-CM | POA: Insufficient documentation

## 2022-12-06 LAB — LIPASE, BLOOD: Lipase: 37 U/L (ref 11–51)

## 2022-12-06 LAB — CBC
HCT: 45.1 % (ref 39.0–52.0)
Hemoglobin: 15.6 g/dL (ref 13.0–17.0)
MCH: 30.9 pg (ref 26.0–34.0)
MCHC: 34.6 g/dL (ref 30.0–36.0)
MCV: 89.3 fL (ref 80.0–100.0)
Platelets: 327 10*3/uL (ref 150–400)
RBC: 5.05 MIL/uL (ref 4.22–5.81)
RDW: 13.3 % (ref 11.5–15.5)
WBC: 8 10*3/uL (ref 4.0–10.5)
nRBC: 0 % (ref 0.0–0.2)

## 2022-12-06 LAB — COMPREHENSIVE METABOLIC PANEL
ALT: 29 U/L (ref 0–44)
AST: 20 U/L (ref 15–41)
Albumin: 4.2 g/dL (ref 3.5–5.0)
Alkaline Phosphatase: 68 U/L (ref 38–126)
Anion gap: 8 (ref 5–15)
BUN: 12 mg/dL (ref 6–20)
CO2: 25 mmol/L (ref 22–32)
Calcium: 9.1 mg/dL (ref 8.9–10.3)
Chloride: 102 mmol/L (ref 98–111)
Creatinine, Ser: 0.78 mg/dL (ref 0.61–1.24)
GFR, Estimated: >60 mL/min (ref >60)
Glucose, Bld: 107 mg/dL — ABNORMAL HIGH (ref 70–99)
Potassium: 3.9 mmol/L (ref 3.5–5.1)
Sodium: 135 mmol/L (ref 135–145)
Total Bilirubin: 0.4 mg/dL (ref 0.3–1.2)
Total Protein: 7.2 g/dL (ref 6.5–8.1)

## 2022-12-06 LAB — URINALYSIS, ROUTINE W REFLEX MICROSCOPIC
Bilirubin Urine: NEGATIVE
Glucose, UA: NEGATIVE mg/dL
Hgb urine dipstick: NEGATIVE
Ketones, ur: NEGATIVE mg/dL
Leukocytes,Ua: NEGATIVE
Nitrite: NEGATIVE
Protein, ur: NEGATIVE mg/dL
Specific Gravity, Urine: 1.02 (ref 1.005–1.030)
pH: 7 (ref 5.0–8.0)

## 2022-12-06 MED ORDER — ONDANSETRON HCL 4 MG PO TABS: 4.0000 mg | ORAL_TABLET | Freq: Four times a day (QID) | ORAL | 0 refills | Status: DC

## 2022-12-06 MED ORDER — MORPHINE SULFATE (PF) 4 MG/ML IV SOLN
4.0000 mg | Freq: Once | INTRAVENOUS | Status: AC
  Administered 2022-12-06: 4 mg via INTRAVENOUS
  Filled 2022-12-06: qty 1

## 2022-12-06 MED ORDER — MORPHINE SULFATE (PF) 2 MG/ML IV SOLN
2.0000 mg | Freq: Once | INTRAVENOUS | Status: AC
  Administered 2022-12-06: 2 mg via INTRAVENOUS
  Filled 2022-12-06: qty 1

## 2022-12-06 MED ORDER — SODIUM CHLORIDE 0.9 % IV BOLUS
1000.0000 mL | Freq: Once | INTRAVENOUS | Status: AC
Start: 2022-12-06 — End: 2022-12-06
  Administered 2022-12-06: 1000 mL via INTRAVENOUS

## 2022-12-06 MED ORDER — IOHEXOL 300 MG/ML  SOLN
100.0000 mL | Freq: Once | INTRAMUSCULAR | Status: AC | PRN
  Administered 2022-12-06: 100 mL via INTRAVENOUS

## 2022-12-06 MED ORDER — ONDANSETRON HCL 4 MG/2ML IJ SOLN
4.0000 mg | Freq: Once | INTRAMUSCULAR | Status: AC
  Administered 2022-12-06: 4 mg via INTRAVENOUS
  Filled 2022-12-06: qty 2

## 2022-12-06 NOTE — ED Provider Notes (Signed)
[place] [facility] Provider Note   CSN: [id number] Arrival date & time: 12/06/22  1430     History  Chief Complaint  Patient presents with   Emesis   Abdominal Pain   HPI Randy Neal is a 47 y.o. male with history of marijuana use, cocaine use, acute pancreatitis presenting for abdominal pain and emesis. Symptoms started this past Friday.  Endorses nausea and vomiting but no diarrhea.  Does state he has had issues with constipation and green-colored stool.  Had normal bowel movement this morning.  Has any urinary changes.  Abdominal pain is primarily about the umbilicus but can radiate to the right lower quadrant at times.  Does not radiate to the back. Denies fever at home.  States he did smoke marijuana two days ago.   Emesis Associated symptoms: abdominal pain   Abdominal Pain Associated symptoms: vomiting        Home Medications Prior to Admission medications   Medication Sig Start Date End Date Taking? Authorizing Provider  ondansetron (ZOFRAN) 4 MG tablet Take 1 tablet (4 mg total) by mouth every 6 (six) hours. 12/06/22  Yes Gareth Eagle, PA-C  atorvastatin (LIPITOR) 40 MG tablet Take 40 mg by mouth daily. 10/28/17   [provider]  buPROPion (WELLBUTRIN XL) 150 MG 24 hr tablet Take 150 mg by mouth daily.  10/23/17   [provider]  DEXILANT 60 MG capsule Take 60 mg by mouth daily.  11/29/17   [provider]  dicyclomine (BENTYL) 20 MG tablet Take 1 tablet (20 mg total) by mouth 2 (two) times daily for 7 days. 06/30/21 07/07/21  Mare Ferrari, PA-C  erythromycin ophthalmic ointment Place a 1/2 inch ribbon of ointment into the left lower eyelid four (4) times daily for fiver (5) days. 06/28/22   Blue, Soijett A, PA-C  lamoTRIgine (LAMICTAL) 25 MG tablet Take 25 mg by mouth daily.  11/23/17   [provider]  loperamide (IMODIUM) 2 MG capsule Take 1 capsule (2 mg total) by mouth 4 (four)  times daily as needed for diarrhea or loose stools. 06/30/21   Mare Ferrari, PA-C  ondansetron (ZOFRAN-ODT) 4 MG disintegrating tablet Take 1 tablet (4 mg total) by mouth every 8 (eight) hours as needed for nausea or vomiting. 06/30/21   Mare Ferrari, PA-C  ranitidine (ZANTAC) 150 MG tablet Take 1 tablet (150 mg total) by mouth 3 (three) times daily. Patient not taking: Reported on 12/06/2017 03/29/17   Zehr, Princella Pellegrini, PA-C  simethicone (GAS-X) 80 MG chewable tablet Chew 80 mg by mouth every 6 (six) hours as needed for flatulence.    [provider]      Allergies    Doxycycline    Review of Systems   Review of Systems  Gastrointestinal:  Positive for abdominal pain and vomiting.    Physical Exam Updated Vital Signs BP (!) 109/94   Pulse (!) 57   Temp 97.8 F (36.6 C)   Resp 19   Ht 5\' 9"  (1.753 m)   Wt 86.6 kg   SpO2 99%   BMI 28.21 kg/m  Physical Exam Vitals and nursing note reviewed.  HENT:     Head: Normocephalic and atraumatic.     Mouth/Throat:     Mouth: Mucous membranes are moist.  Eyes:     General:        Right eye: No discharge.        Left eye: No  discharge.     Conjunctiva/sclera: Conjunctivae normal.  Cardiovascular:     Rate and Rhythm: Normal rate and regular rhythm.     Pulses: Normal pulses.     Heart sounds: Normal heart sounds.  Pulmonary:     Effort: Pulmonary effort is normal.     Breath sounds: Normal breath sounds.  Abdominal:     General: Abdomen is flat.     Palpations: Abdomen is soft.     Tenderness: There is abdominal tenderness in the periumbilical area.  Skin:    General: Skin is warm and dry.  Neurological:     General: No focal deficit present.  Psychiatric:        Mood and Affect: Mood normal.     ED Results / Procedures / Treatments   Labs (all labs ordered are listed, but only abnormal results are displayed) Labs Reviewed  COMPREHENSIVE METABOLIC PANEL - Abnormal; Notable for the following components:       Result Value   Glucose, Bld 107 (*)    All other components within normal limits  LIPASE, BLOOD  CBC  URINALYSIS, ROUTINE W REFLEX MICROSCOPIC    EKG None  Radiology CT ABDOMEN PELVIS W CONTRAST  Result Date: 12/06/2022 CLINICAL DATA:  Right lower quadrant pain EXAM: CT ABDOMEN AND PELVIS WITH CONTRAST TECHNIQUE: Multidetector CT imaging of the abdomen and pelvis was performed using the standard protocol following bolus administration of intravenous contrast. RADIATION DOSE REDUCTION: This exam was performed according to the departmental dose-optimization program which includes automated exposure control, adjustment of the mA and/or kV according to patient size and/or use of iterative reconstruction technique. CONTRAST:  OMNIPAQUE IOHEXOL 300 MG/ML  SOLN COMPARISON:  CT 06/30/2021 FINDINGS: Lower chest: Lung bases demonstrate no acute airspace disease. Hepatobiliary: Subcentimeter hypodensity in the left hepatic lobe too small to further characterize. Probable focal fat infiltration near falciform ligament. No calcified gallstone or biliary dilatation Pancreas: Unremarkable. No pancreatic ductal dilatation or surrounding inflammatory changes. Spleen: Normal in size without focal abnormality. Adrenals/Urinary Tract: Adrenal glands are unremarkable. Kidneys are normal, without renal calculi, focal lesion, or hydronephrosis. Left posterior bladder diverticulum. Stomach/Bowel: Stomach is within normal limits. Appendix appears normal. No evidence of bowel wall thickening, distention, or inflammatory changes. Vascular/Lymphatic: Moderate aortic atherosclerosis. No aneurysm. No suspicious lymph nodes Reproductive: Prostate is unremarkable. Other: Negative for pelvic effusion or free air Musculoskeletal: No acute or suspicious osseous abnormality IMPRESSION: 1. No CT evidence for acute intra-abdominal or pelvic abnormality. 2. Left posterior bladder diverticulum. 3. Aortic atherosclerosis. Aortic  Atherosclerosis (ICD10-I70.0). Electronically Signed   By: Jasmine Pang M.D.   On: 12/06/2022 22:05    Procedures Procedures    Medications Ordered in ED Medications  sodium chloride 0.9 % bolus 1,000 mL (0 mLs Intravenous Stopped 12/06/22 2053)  ondansetron (ZOFRAN) injection 4 mg (4 mg Intravenous Given 12/06/22 1816)  morphine (PF) 4 MG/ML injection 4 mg (4 mg Intravenous Given 12/06/22 1816)  iohexol (OMNIPAQUE) 300 MG/ML solution 100 mL (100 mLs Intravenous Contrast Given 12/06/22 1916)  morphine (PF) 2 MG/ML injection 2 mg (2 mg Intravenous Given 12/06/22 2051)    ED Course/ Medical Decision Making/ A&P                                 Medical Decision Making Amount and/or Complexity of Data Reviewed Labs: ordered. Radiology: ordered.  Risk Prescription drug management.   Initial Impression and Ddx 47 year old  well-appearing male presenting for abdominal pain.  Exam notable for periumbilical tenderness.  DDx includes appendicitis, diverticulitis, acute cholecystitis, pancreatitis, cannabinoid hyperemesis syndrome other. Patient PMH that increases complexity of ED encounter:  history of marijuana use, cocaine use, acute pancreatitis  Interpretation of Diagnostics - I independent reviewed and interpreted the labs as followed: none  - I independently visualized the following imaging with scope of interpretation limited to determining acute life threatening conditions related to emergency care: CT ab/pelvis, which revealed no acute findings.  Did reveal left posterior bladder diverticulum.  Patient Reassessment and Ultimate Disposition/Management On reassessment, patient remained clinically well and pain improved after intervention.  CT scan and workup was overall unremarkable. Fluid challenged with no complication.  Discussed CT findings with patient.  Patient did recently smoke marijuana.  Could be [employer] driving symptoms.  Advised follow-up PCP. Vitals remained stable  throughout encounter.  Discharged.  Patient management required discussion with the following services or consulting groups:  None  Complexity of Problems Addressed Acute complicated illness or Injury  Additional Data Reviewed and Analyzed Further history obtained from: Further history from spouse/family member, Past medical history and medications listed in the EMR, and Prior ED visit notes  Patient Encounter Risk Assessment None         Final Clinical Impression(s) / ED Diagnoses Final diagnoses:  Abdominal pain, unspecified abdominal location    Rx / DC Orders ED Discharge Orders          Ordered    ondansetron (ZOFRAN) 4 MG tablet  Every 6 hours        12/06/22 2226              Gareth Eagle, PA-C 12/06/22 2227    Loetta Rough, MD 12/06/22 2313

## 2022-12-06 NOTE — Discharge Instructions (Addendum)
Evaluation today for your abdominal pain nausea and vomiting was overall reassuring.  CT scan did not reveal any acute findings.  My suspicion is its either a viral illness or due to recent marijuana use could be cannabinoid hyperemesis syndrome.  Treatment will be the same at home which is conservative.  Recommend rest and assertive hydration.  If your symptoms worsen, please return emergency department for further evaluation.  Otherwise recommend follow-up PCP.  I have sent Zofran to your pharmacy to help with nausea and vomiting at home.

## 2022-12-06 NOTE — ED Notes (Signed)
Pt tolerating PO intake. EDP made aware.

## 2022-12-06 NOTE — ED Notes (Signed)
Pt transported to imaging.

## 2022-12-06 NOTE — ED Triage Notes (Signed)
Pt reports n/v with burning RLQ ABD pain, constipation, and green colored stool since Friday; denies gas, heartburn, or fever; denies dysuria but has had increased urination, no rebound tenderness noted on palpation

## 2022-12-06 NOTE — ED Notes (Signed)
D/c paperwork reviewed with pt, including prescriptions and follow up care.  All questions and/or concerns addressed at time of d/c.  No further needs expressed. . Pt verbalized understanding, Ambulatory with family to ED exit, NAD.   

## 2022-12-06 NOTE — ED Notes (Signed)
Pt provided apple juice for PO challenge.  

## 2023-10-10 NOTE — Progress Notes (Signed)
 [address] - AMBULATORY [facility]  - [facility] [phone] [place] [id number]   October 10, 2023  Patient ID: Randy Neal is a 48 y.o. male   Chief Complaint  Patient presents with  . Back Pain     HPI: Randy Neal present to the office for an acute visit c/o lower back pain. Pain started about a week ago. Aggravated with turning, walking, bending, coughing. Pain is sharp and constant. Denies any falls, weakness, saddle anesthesia, tingling or numbness sensation. Has been doing a lot of heavy lifting at his job in the last month. 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 Has tried Voltaren  gel on his back and has been taking Tylenol . Has not tried PT.  ROS   A complete ROS was performed with pertinent positives/negatives noted in the HPI. The remainder of the ROS are negative.   Assessment/Plan:    Randy Neal was seen today for back pain.  Diagnoses and all orders for this visit:  Acute midline low back pain without sciatica Muscle spasm Muscle spasm of the left lumbar paraspinal muscle, since he has some bony tenderness at the L4-L5 and sacrum area will obtain x-ray.  Injection given today.  Start Relafen and Zanaflex.  Heat twice a day.  Massage and stretching exercises recommended.  Orders: -     nabumetone (Relafen) 500 mg tablet; Take 1 tablet (500 mg total) by mouth 2 (two) times a day. -     tiZANidine (ZANAFLEX) 2 mg capsule; Take 1 capsule (2 mg total) by mouth every 8 (eight) hours as needed for muscle spasms. -     ketorolac (TORADOL) injection 30 mg -     XR Spine Lumbar 2-3 Views; Future -     XR Sacrum Coccyx Minimum 2 Views; Future   Patient verbalizes understanding and in agreement with the above plan. All questions answered.  Medication side effects discussed with patient. Advised patient to call clinic or return for visit if symptoms occur. Goals of care discussed with  patient including med compliance and adequate follow up.  Return if symptoms worsen or fail to improve.   Physical Exam    Vital Signs  Vitals:   10/10/23 1109  BP: 126/70  BP Location: Left arm  Patient Position: Sitting  Pulse: 76  Resp: 18  SpO2: 96%    Constitutional: Well-developed and well-nourished. Sitting in wheelchair, mild distress. Cardiovascular: +S1S2, regular rate and rhythm, no murmurs, gallops or rubs appreciated. No carotid bruits. Respiratory: Normal effort, clear to auscultation bilaterally. No wheezes, rales or rhonchi noted.  Back: Tenderness through the paraspinal muscles at the lumbar spine, there is bony tenderness to palpation at the level of L4-L5 and sacrum area.  Strength 5/5.  Sensation is intact.  Antalgic gait. Extremities: No cyanosis, clubbing or edema. Pulses 2+ throughout  Neuro: Alert and oriented x 3. Cranial nerves II-XII grossly intact. Skin: Skin is warm and dry. No rash noted.  Medical History    Health Maintenance reviewed.  Allergies Allergies[1]  Medications Current Medications[2]  Medical history Medical History[3]  Surgical History Surgical History[4]  Social History Tobacco Use History[5]  Family History Family History[6]  I have reviewed and (if needed) updated the patient's problem list, medications, allergies, past medical and surgical history, social and family history.  This document serves as a record of services personally performed by Dr. Nikki.  It was created on their behalf by  Delon Macario Fairly, LPN, a trained medical scribe, and Licensed Psychologist, prison and probation services (LPN). During the course of documenting the history, physical exam and medical decision making, I was functioning as a Stage manager. The creation of this record is the provider's dictation and/or activities during the visit.  Electronically signed by Delon Macario Fairly, LPN 1/74/7974 89:95 AM  I agree the documentation is accurate and  complete.  Aliene Cary, MD  Note - This document was created using aid of voice recognition Dragon dictation software       [1] Allergies Allergen Reactions  . Doxycycline Hives and Rash  [2] Current Outpatient Medications  Medication Sig Dispense Refill  . acetaminophen  (Tylenol  Arthritis Pain) 650 mg ER tablet Take 1,300 mg by mouth 3 (three) times a day as needed.    SABRA atoMOXetine (STRATTERA) 60 mg capsule Take 1 cap daily 30 capsule 2  . atorvastatin (LIPITOR) 40 mg tablet Take 1 tablet (40 mg total) by mouth at bedtime. 90 tablet 3  . busPIRone (BUSPAR) 10 mg tablet Take 1 tablet bid 60 tablet 2  . lamoTRIgine  (LaMICtal ) 100 mg tablet Take one tablet at bedtime 90 tablet 3  . pantoprazole  (PROTONIX ) 40 mg EC tablet Take 1 tablet (40 mg total) by mouth every morning before breakfast. 90 tablet 3  . nabumetone (Relafen) 500 mg tablet Take 1 tablet (500 mg total) by mouth 2 (two) times a day. 30 tablet 0  . tamsulosin (FLOMAX) 0.4 mg cap Take 0.4 mg by mouth daily. (Patient not taking: Reported on 10/10/2023)    . tiZANidine (ZANAFLEX) 2 mg capsule Take 1 capsule (2 mg total) by mouth every 8 (eight) hours as needed for muscle spasms. 30 capsule 0   No current facility-administered medications for this visit.  [3] Past Medical History: Diagnosis Date  . ADHD (attention deficit hyperactivity disorder) 1988  . Anxiety 1994  . Arthritis   . Crack cocaine use   . Depression   . GERD (gastroesophageal reflux disease)   . History of kidney stones   . Hyperlipidemia   . Wears glasses   [4] Past Surgical History: Procedure Laterality Date  . HUMERAL HEMIARTHROPLASTY Left 04/05/2023   ARTHROPLASTY SHOULDER TOTAL REPLACEMENT - SDD performed by [place] Miller Case, MD at [facility] OR  . JOINT REPLACEMENT     Shoulder  . KIDNEY STONE SURGERY    . LITHOTRIPSY    . TOTAL SHOULDER REPLACEMENT Left 04/05/2023  . TRANSURETHRAL RESECTION OF PROSTATE N/A 06/08/2023   CYSTOSCOPY PROSTATE  TRANSURETHRAL RESECTION performed by Rhae Loges, MD at [facility] OR  [5] Social History Tobacco Use  Smoking Status Every Day  . Current packs/day: 0.00  . Types: Cigarettes  . Last attempt to quit: 03/12/2023  . Years since quitting: 0.5  Smokeless Tobacco Never  [6] Family History Problem Relation Name Age of Onset  . Diabetes Mother Diane   . Hyperlipidemia Mother Diane   . Early death Father    . Depression Father    . Lung cancer Father    . Alcohol abuse Father

## 2023-10-13 NOTE — Telephone Encounter (Signed)
 Looks like pt is scheduled in September

## 2023-10-14 ENCOUNTER — Emergency Department (HOSPITAL_BASED_OUTPATIENT_CLINIC_OR_DEPARTMENT_OTHER): Payer: MEDICAID

## 2023-10-14 ENCOUNTER — Emergency Department (HOSPITAL_BASED_OUTPATIENT_CLINIC_OR_DEPARTMENT_OTHER)
Admission: EM | Admit: 2023-10-14 | Discharge: 2023-10-14 | Disposition: A | Discharge status: Home / Self Care | Payer: MEDICAID | Attending: Emergency Medicine | Admitting: Emergency Medicine

## 2023-10-14 ENCOUNTER — Other Ambulatory Visit: Payer: Self-pay

## 2023-10-14 ENCOUNTER — Encounter (HOSPITAL_BASED_OUTPATIENT_CLINIC_OR_DEPARTMENT_OTHER): Payer: Self-pay

## 2023-10-14 DIAGNOSIS — R1013 Epigastric pain: Secondary | ICD-10-CM | POA: Insufficient documentation

## 2023-10-14 DIAGNOSIS — R109 Unspecified abdominal pain: Secondary | ICD-10-CM

## 2023-10-14 DIAGNOSIS — F17210 Nicotine dependence, cigarettes, uncomplicated: Secondary | ICD-10-CM | POA: Insufficient documentation

## 2023-10-14 DIAGNOSIS — R112 Nausea with vomiting, unspecified: Secondary | ICD-10-CM | POA: Diagnosis present

## 2023-10-14 DIAGNOSIS — R1110 Vomiting, unspecified: Secondary | ICD-10-CM

## 2023-10-14 LAB — COMPREHENSIVE METABOLIC PANEL WITH GFR
ALT: 44 U/L (ref 0–44)
AST: 28 U/L (ref 15–41)
Albumin: 4.7 g/dL (ref 3.5–5.0)
Alkaline Phosphatase: 80 U/L (ref 38–126)
Anion gap: 13 (ref 5–15)
BUN: 22 mg/dL — ABNORMAL HIGH (ref 6–20)
CO2: 24 mmol/L (ref 22–32)
Calcium: 10.4 mg/dL — ABNORMAL HIGH (ref 8.9–10.3)
Chloride: 105 mmol/L (ref 98–111)
Creatinine, Ser: 1.02 mg/dL (ref 0.61–1.24)
GFR, Estimated: >60 mL/min (ref >60)
Glucose, Bld: 110 mg/dL — ABNORMAL HIGH (ref 70–99)
Potassium: 4.4 mmol/L (ref 3.5–5.1)
Sodium: 142 mmol/L (ref 135–145)
Total Bilirubin: 0.2 mg/dL (ref 0.0–1.2)
Total Protein: 7.2 g/dL (ref 6.5–8.1)

## 2023-10-14 LAB — CBC
HCT: 42.2 % (ref 39.0–52.0)
Hemoglobin: 14 g/dL (ref 13.0–17.0)
MCH: 29.2 pg (ref 26.0–34.0)
MCHC: 33.2 g/dL (ref 30.0–36.0)
MCV: 88.1 fL (ref 80.0–100.0)
Platelets: 373 K/uL (ref 150–400)
RBC: 4.79 MIL/uL (ref 4.22–5.81)
RDW: 13.4 % (ref 11.5–15.5)
WBC: 7.6 K/uL (ref 4.0–10.5)
nRBC: 0 % (ref 0.0–0.2)

## 2023-10-14 LAB — URINALYSIS, ROUTINE W REFLEX MICROSCOPIC
Bilirubin Urine: NEGATIVE
Glucose, UA: NEGATIVE mg/dL
Hgb urine dipstick: NEGATIVE
Ketones, ur: NEGATIVE mg/dL
Leukocytes,Ua: NEGATIVE
Nitrite: NEGATIVE
Protein, ur: NEGATIVE mg/dL
Specific Gravity, Urine: 1.025 (ref 1.005–1.030)
pH: 6.5 (ref 5.0–8.0)

## 2023-10-14 LAB — LIPASE, BLOOD: Lipase: 38 U/L (ref 11–51)

## 2023-10-14 MED ORDER — HYDROMORPHONE HCL 1 MG/ML IJ SOLN
0.5000 mg | Freq: Once | INTRAMUSCULAR | Status: AC
  Administered 2023-10-14: 0.5 mg via INTRAVENOUS
  Filled 2023-10-14: qty 1

## 2023-10-14 MED ORDER — IOHEXOL 300 MG/ML  SOLN
100.0000 mL | Freq: Once | INTRAMUSCULAR | Status: AC | PRN
  Administered 2023-10-14: 100 mL via INTRAVENOUS

## 2023-10-14 MED ORDER — DROPERIDOL 2.5 MG/ML IJ SOLN
2.5000 mg | Freq: Once | INTRAMUSCULAR | Status: AC
  Administered 2023-10-14: 2.5 mg via INTRAVENOUS
  Filled 2023-10-14: qty 2

## 2023-10-14 MED ORDER — ONDANSETRON HCL 4 MG/2ML IJ SOLN
4.0000 mg | Freq: Once | INTRAMUSCULAR | Status: DC | PRN
  Filled 2023-10-14: qty 2

## 2023-10-14 MED ORDER — SODIUM CHLORIDE 0.9 % IV BOLUS
1000.0000 mL | Freq: Once | INTRAVENOUS | Status: AC
  Administered 2023-10-14: 1000 mL via INTRAVENOUS

## 2023-10-14 MED ORDER — METOCLOPRAMIDE HCL 5 MG/ML IJ SOLN
10.0000 mg | Freq: Once | INTRAMUSCULAR | Status: AC
  Administered 2023-10-14: 10 mg via INTRAVENOUS
  Filled 2023-10-14: qty 2

## 2023-10-14 MED ORDER — ONDANSETRON HCL 4 MG/2ML IJ SOLN
4.0000 mg | Freq: Once | INTRAMUSCULAR | Status: AC
  Administered 2023-10-14: 4 mg via INTRAVENOUS

## 2023-10-14 MED ORDER — ONDANSETRON HCL 4 MG PO TABS: 4.0000 mg | ORAL_TABLET | Freq: Four times a day (QID) | ORAL | 0 refills | Status: AC

## 2023-10-14 NOTE — ED Triage Notes (Signed)
 Pt started with sharp pain below his belly button and has now moved up. States he has been throwing up violently this am. Has had these symptoms a few days this week, but worse today. Pt progressively worse since this am.

## 2023-10-14 NOTE — ED Notes (Signed)
 Patient transported to CT

## 2023-10-14 NOTE — ED Provider Notes (Signed)
  Physical Exam  BP 109/82 (BP Location: Right Arm)   Pulse (!) 54   Temp 98.2 F (36.8 C) (Oral)   Resp 17   Ht 5' 9 (1.753 m)   Wt 90.7 kg   SpO2 99%   BMI 29.53 kg/m   Physical Exam Vitals and nursing note reviewed.  HENT:     Head: Normocephalic and atraumatic.  Eyes:     Pupils: Pupils are equal, round, and reactive to light.  Cardiovascular:     Rate and Rhythm: Normal rate and regular rhythm.  Pulmonary:     Effort: Pulmonary effort is normal.     Breath sounds: Normal breath sounds.  Abdominal:     Palpations: Abdomen is soft.     Tenderness: There is no abdominal tenderness.  Skin:    General: Skin is warm and dry.  Neurological:     Mental Status: He is alert.  Psychiatric:        Mood and Affect: Mood normal.     Procedures  Procedures  ED Course / MDM   Clinical Course as of 10/14/23 1659  Fri Oct 14, 2023  1658 Patient feeling better.  Requesting discharge.  Unclear cause of his abdominal pain.  Afebrile normotensive now.  Laboratory workup UA unremarkable.  CT abdomen pelvis shows no acute findings that would explain his symptoms.  Most likely related to cannabinoid hyperemesis.  Counseled him on symptomatic management.  Will call in Zofran  to his pharmacy. [MP]    Clinical Course User Index [MP] Pamella Ozell LABOR, DO   Medical Decision Making I, Ozell Pamella DO, have assumed care of this patient from the previous provider pending reevaluation after medications for abdominal pain nausea vomiting  Amount and/or Complexity of Data Reviewed Labs: ordered. Radiology: ordered.  Risk Prescription drug management.          Pamella Ozell LABOR, DO 10/14/23 1659

## 2023-10-14 NOTE — Discharge Instructions (Signed)
 You were seen in the emergency room for abdominal pain nausea and vomiting Your pain improved after several medications and IV fluids here We have called in prescription for Zofran  for you to pick up from your pharmacy and begin taking as directed for nausea and vomiting at home Refrain from using marijuana as this can make your symptoms worse Return to the emerged from with severe pain, if you are unable to eat or drink or have any other concerns Follow-up with your primary care doctor within 1 week for reevaluation

## 2023-10-14 NOTE — ED Provider Notes (Signed)
 [place] [facility] AT [facility] Provider Note  CSN: [id number] Arrival date & time: 10/14/23 1119  Chief Complaint(s) Abdominal Pain  HPI Randy Neal is a 48 y.o. male history pancreatitis, polysubstance abuse presenting to the emergency department with nausea and vomiting.  Reports nausea and vomiting starting this morning.  Reports upper abdominal pain.  Denies any hematemesis, hematochezia, melena.  No painful urination.  No chest pain or shortness of breath.  Reports that he had some pain in the past couple days also but today is the worst.   Past Medical History Past Medical History:  Diagnosis Date   Acute pancreatitis    Arthritis    Depression    GERD (gastroesophageal reflux disease)    Hyperlipidemia    Kidney stone    Patient Active Problem List   Diagnosis Date Noted   Belching 04/06/2017   Abdominal pain, epigastric 04/06/2017   Acute pancreatitis 02/20/2017   Substance induced mood disorder (HCC) 01/04/2013   Cocaine abuse with intoxication and without complication (HCC) 01/04/2013   Cannabis dependence with intoxication (HCC) 01/04/2013   Bipolar disorder, unspecified (HCC) 01/04/2013   Home Medication(s) Prior to Admission medications   Medication Sig Start Date End Date Taking? Authorizing Provider  atorvastatin (LIPITOR) 40 MG tablet Take 40 mg by mouth daily. 10/28/17   [provider]  buPROPion  (WELLBUTRIN  XL) 150 MG 24 hr tablet Take 150 mg by mouth daily.  10/23/17   [provider]  DEXILANT 60 MG capsule Take 60 mg by mouth daily.  11/29/17   [provider]  dicyclomine  (BENTYL ) 20 MG tablet Take 1 tablet (20 mg total) by mouth 2 (two) times daily for 7 days. 06/30/21 07/07/21  Vonn Hadassah LABOR, PA-C  erythromycin  ophthalmic ointment Place a 1/2 inch ribbon of ointment into the left lower eyelid four (4) times daily for fiver (5) days. 06/28/22   Blue, Soijett A, PA-C  lamoTRIgine  (LAMICTAL ) 25  MG tablet Take 25 mg by mouth daily.  11/23/17   [provider]  loperamide  (IMODIUM ) 2 MG capsule Take 1 capsule (2 mg total) by mouth 4 (four) times daily as needed for diarrhea or loose stools. 06/30/21   Vonn Hadassah LABOR, PA-C  ondansetron  (ZOFRAN ) 4 MG tablet Take 1 tablet (4 mg total) by mouth every 6 (six) hours. 12/06/22   Robinson, John K, PA-C  ondansetron  (ZOFRAN -ODT) 4 MG disintegrating tablet Take 1 tablet (4 mg total) by mouth every 8 (eight) hours as needed for nausea or vomiting. 06/30/21   Vonn Hadassah LABOR, PA-C  ranitidine  (ZANTAC ) 150 MG tablet Take 1 tablet (150 mg total) by mouth 3 (three) times daily. Patient not taking: Reported on 12/06/2017 03/29/17   Zehr, Jessica D, PA-C  simethicone  (GAS-X) 80 MG chewable tablet Chew 80 mg by mouth every 6 (six) hours as needed for flatulence.    [provider]  Past Surgical History Past Surgical History:  Procedure Laterality Date   KIDNEY STONE REMOVAL     KIDNEY STONE SURGERY Right 04/20/1994   Family History Family History  Problem Relation Age of Onset   Alcohol abuse Mother    Diabetes Mother    Alcohol abuse Father    Arthritis Father    Lung cancer Father    Cancer Maternal Aunt    Arthritis Maternal Grandmother    Cancer Maternal Grandmother    Cancer Maternal Grandfather    Prostate cancer Maternal Grandfather    Breast cancer Paternal Aunt    Liver disease Paternal Uncle     Social History Social History   Tobacco Use   Smoking status: Every Day    Current packs/day: 0.00    Average packs/day: 2.0 packs/day for 15.0 years (30.0 ttl pk-yrs)    Types: Cigarettes    Start date: 02/15/2002    Last attempt to quit: 02/15/2017    Years since quitting: 6.6   Smokeless tobacco: Current   Tobacco comments:    patch  Vaping Use   Vaping status: Never Used  Substance Use  Topics   Alcohol use: Not Currently    Alcohol/week: 3.0 standard drinks of alcohol    Types: 3 Cans of beer per week    Comment: everyday   Drug use: Yes    Types: Marijuana, Cocaine    Comment: 12/06/22 - not doing cocaine, just occ marijuana   Allergies Doxycycline  Review of Systems Review of Systems  All other systems reviewed and are negative.   Physical Exam Vital Signs  I have reviewed the triage vital signs BP (!) 201/101   Pulse (!) 56   Temp 98.1 F (36.7 C)   Resp 14   Ht 5' 9 (1.753 m)   Wt 90.7 kg   SpO2 100%   BMI 29.53 kg/m  Physical Exam Vitals and nursing note reviewed.  Constitutional:      General: He is not in acute distress.    Appearance: Normal appearance.     Comments: Persistent vomiting  HENT:     Mouth/Throat:     Mouth: Mucous membranes are moist.  Eyes:     Conjunctiva/sclera: Conjunctivae normal.  Cardiovascular:     Rate and Rhythm: Normal rate and regular rhythm.  Pulmonary:     Effort: Pulmonary effort is normal. No respiratory distress.     Breath sounds: Normal breath sounds.  Abdominal:     General: Abdomen is flat.     Palpations: Abdomen is soft.     Tenderness: There is abdominal tenderness in the epigastric area.  Musculoskeletal:     Right lower leg: No edema.     Left lower leg: No edema.  Skin:    General: Skin is warm and dry.     Capillary Refill: Capillary refill takes less than 2 seconds.  Neurological:     Mental Status: He is alert and oriented to person, place, and time. Mental status is at baseline.  Psychiatric:        Mood and Affect: Mood normal.        Behavior: Behavior normal.     ED Results and Treatments Labs (all labs ordered are listed, but only abnormal results are displayed) Labs Reviewed  COMPREHENSIVE METABOLIC PANEL WITH GFR - Abnormal; Notable for the following components:      Result Value   Glucose, Bld 110 (*)    BUN 22 (*)  Calcium 10.4 (*)    All other components within  normal limits  LIPASE, BLOOD  CBC  URINALYSIS, ROUTINE W REFLEX MICROSCOPIC                                                                                                                          Radiology CT ABDOMEN PELVIS W CONTRAST Result Date: 10/14/2023 CLINICAL DATA:  Acute, non localized abdominal pain that began inferiorly and since moved more superiorly over the past few days, worse today. The patient began violently vomiting this morning. EXAM: CT ABDOMEN AND PELVIS WITH CONTRAST TECHNIQUE: Multidetector CT imaging of the abdomen and pelvis was performed using the standard protocol following bolus administration of intravenous contrast. RADIATION DOSE REDUCTION: This exam was performed according to the departmental dose-optimization program which includes automated exposure control, adjustment of the mA and/or kV according to patient size and/or use of iterative reconstruction technique. CONTRAST:  OMNIPAQUE  IOHEXOL  300 MG/ML  SOLN COMPARISON:  12/06/2022 FINDINGS: Lower chest: Normal-sized heart.  Clear lung bases. Hepatobiliary: Stable small lateral segment left lobe liver cyst and focal fat deposition adjacent to the falciform ligament. Normal-appearing gallbladder. Pancreas: Unremarkable. No pancreatic ductal dilatation or surrounding inflammatory changes. Spleen: Normal in size without focal abnormality. Adrenals/Urinary Tract: Normal-appearing adrenal glands. Stable small simple appearing left renal cyst, not needing imaging follow-up. Small right renal cortical scar. Stable posterior bladder diverticulum on the left. Stable mild diffuse bladder wall thickening. Unremarkable ureters. Stomach/Bowel: Fecalized material in normal caliber small bowel loops in the pelvis. No dilated bowel. Unremarkable stomach, colon and appendix. Vascular/Lymphatic: Atheromatous arterial calcifications without aneurysm. No enlarged lymph nodes. Reproductive: Mildly enlarged prostate containing coarse  calcifications. Other: Small umbilical and left inguinal hernias containing fat. Musculoskeletal: Mild-to-moderate lumbar and lower thoracic spine degenerative changes and mild scoliosis. IMPRESSION: 1. No acute abnormality. 2. Fecalized material in normal caliber small bowel loops in the pelvis, suggesting slow transit. 3. Stable mild diffuse bladder wall thickening and left posterior bladder diverticulum, most likely due to chronic bladder outlet obstruction by the mildly enlarged prostate gland. 4. Small umbilical and left inguinal hernias containing fat. Electronically Signed   By: Elspeth Bathe M.D.   On: 10/14/2023 13:24    Pertinent labs & imaging results that were available during my care of the patient were reviewed by me and considered in my medical decision making (see MDM for details).  Medications Ordered in ED Medications  HYDROmorphone  (DILAUDID ) injection 0.5 mg (has no administration in time range)  ondansetron  (ZOFRAN ) injection 4 mg (4 mg Intravenous Given 10/14/23 1218)  HYDROmorphone  (DILAUDID ) injection 0.5 mg (0.5 mg Intravenous Given 10/14/23 1225)  sodium chloride  0.9 % bolus 1,000 mL (1,000 mLs Intravenous New Bag/Given 10/14/23 1225)  iohexol  (OMNIPAQUE ) 300 MG/ML solution 100 mL (100 mLs Intravenous Contrast Given 10/14/23 1250)  metoCLOPramide  (REGLAN ) injection 10 mg (10 mg Intravenous Given 10/14/23 1323)  droperidol  (INAPSINE ) 2.5 MG/ML injection 2.5 mg (2.5 mg Intravenous Given 10/14/23 1423)  droperidol  (  INAPSINE ) 2.5 MG/ML injection 2.5 mg (2.5 mg Intravenous Given 10/14/23 1527)                                                                                                                                     Procedures Procedures  (including critical care time)  Medical Decision Making / ED Course   MDM:  48 year old presenting to the emergency department nausea and vomiting.  Patient overall well-appearing, but having frequent vomiting.  Vitals with  hypertension.  No tachycardia, no fever.  Exam with some epigastric tenderness.  Given vomiting, tenderness obtain CT scan to evaluate for acute intra-abdominal process perforation, obstruction, volvulus, pancreatitis, cholecystitis, appendicitis.  CT scan negative for any acute process, other than some small bowel fecalization without obstruction with possible slow transit.  Doubt any other unusual process.  Considered other unusual process such as ACS EKG is reassuring.  Patient has received repeated doses of pain medication and nausea medication but is having intractable nausea and vomiting.  Signed out to oncoming provider pending additional trial of medication, if this is unsuccessful may need admission.      Additional history obtained: -Additional history obtained from family -External records from outside source obtained and reviewed including: Chart review including previous notes, labs, imaging, consultation notes including prior notes    Lab Tests: -I ordered, reviewed, and interpreted labs.   The pertinent results include:   Labs Reviewed  COMPREHENSIVE METABOLIC PANEL WITH GFR - Abnormal; Notable for the following components:      Result Value   Glucose, Bld 110 (*)    BUN 22 (*)    Calcium 10.4 (*)    All other components within normal limits  LIPASE, BLOOD  CBC  URINALYSIS, ROUTINE W REFLEX MICROSCOPIC    Notable for no AKI or leukocytosis   EKG   EKG Interpretation Date/Time:  Friday October 14 2023 11:32:27 EDT Ventricular Rate:  56 PR Interval:  162 QRS Duration:  100 QT Interval:  364 QTC Calculation: 352 R Axis:   19  Text Interpretation: Sinus rhythm Confirmed by Francesca Fallow ([id number]) on 10/14/2023 2:50:13 PM         Imaging Studies ordered: I ordered imaging studies including CT abdomen On my interpretation imaging demonstrates no acute process I independently visualized and interpreted imaging. I agree with the radiologist  interpretation   Medicines ordered and prescription drug management: Meds ordered this encounter  Medications   DISCONTD: ondansetron  (ZOFRAN ) injection 4 mg   ondansetron  (ZOFRAN ) injection 4 mg   HYDROmorphone  (DILAUDID ) injection 0.5 mg   sodium chloride  0.9 % bolus 1,000 mL   iohexol  (OMNIPAQUE ) 300 MG/ML solution 100 mL   metoCLOPramide  (REGLAN ) injection 10 mg   droperidol  (INAPSINE ) 2.5 MG/ML injection 2.5 mg   droperidol  (INAPSINE ) 2.5 MG/ML injection 2.5 mg   HYDROmorphone  (DILAUDID ) injection 0.5 mg    -I have reviewed the patients home medicines and have  made adjustments as needed   Social Determinants of Health:  Diagnosis or treatment significantly limited by social determinants of health: polysubstance abuse   Reevaluation: After the interventions noted above, I reevaluated the patient and found that their symptoms have stayed the same  Co morbidities that complicate the patient evaluation  Past Medical History:  Diagnosis Date   Acute pancreatitis    Arthritis    Depression    GERD (gastroesophageal reflux disease)    Hyperlipidemia    Kidney stone       Dispostion: Disposition decision including need for hospitalization was considered, and patient disposition pending at time of sign out.    Final Clinical Impression(s) / ED Diagnoses Final diagnoses:  Intractable vomiting     This chart was dictated using voice recognition software.  Despite best efforts to proofread,  errors can occur which can change the documentation meaning.    Francesca Elsie CROME, MD 10/14/23 1536

## 2024-03-04 ENCOUNTER — Ambulatory Visit: Payer: Self-pay

## 8386-10-17 DEATH — deceased
